# Patient Record
Sex: Male | Born: 1980
Health system: Southern US, Community
[De-identification: ages and names within clinical notes are randomized; demographics above are authoritative.]

## PROBLEM LIST (undated history)

## (undated) DIAGNOSIS — I10 Essential (primary) hypertension: Secondary | ICD-10-CM

## (undated) DIAGNOSIS — E119 Type 2 diabetes mellitus without complications: Secondary | ICD-10-CM

## (undated) HISTORY — PX: ANKLE SURGERY: SHX546

---
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
## 2002-06-07 ENCOUNTER — Emergency Department (HOSPITAL_COMMUNITY): Admission: EM | Admit: 2002-06-07 | Discharge: 2002-06-07 | Payer: Self-pay | Admitting: Emergency Medicine

## 2002-06-08 ENCOUNTER — Emergency Department (HOSPITAL_COMMUNITY): Admission: EM | Admit: 2002-06-08 | Discharge: 2002-06-08 | Payer: Self-pay | Admitting: Emergency Medicine

## 2002-06-10 ENCOUNTER — Emergency Department (HOSPITAL_COMMUNITY): Admission: EM | Admit: 2002-06-10 | Discharge: 2002-06-10 | Payer: Self-pay | Admitting: Emergency Medicine

## 2002-07-28 ENCOUNTER — Encounter: Payer: Self-pay | Admitting: Emergency Medicine

## 2002-07-28 ENCOUNTER — Emergency Department (HOSPITAL_COMMUNITY): Admission: EM | Admit: 2002-07-28 | Discharge: 2002-07-28 | Payer: Self-pay | Admitting: Emergency Medicine

## 2002-09-17 ENCOUNTER — Emergency Department (HOSPITAL_COMMUNITY): Admission: EM | Admit: 2002-09-17 | Discharge: 2002-09-17 | Payer: Self-pay | Admitting: Emergency Medicine

## 2002-12-02 ENCOUNTER — Emergency Department (HOSPITAL_COMMUNITY): Admission: EM | Admit: 2002-12-02 | Discharge: 2002-12-02 | Payer: Self-pay | Admitting: Emergency Medicine

## 2006-01-01 ENCOUNTER — Emergency Department (HOSPITAL_COMMUNITY): Admission: EM | Admit: 2006-01-01 | Discharge: 2006-01-01 | Payer: Self-pay | Admitting: Emergency Medicine

## 2008-04-25 ENCOUNTER — Emergency Department (HOSPITAL_COMMUNITY): Admission: EM | Admit: 2008-04-25 | Discharge: 2008-04-25 | Payer: Self-pay | Admitting: Family Medicine

## 2008-06-13 ENCOUNTER — Emergency Department (HOSPITAL_COMMUNITY): Admission: EM | Admit: 2008-06-13 | Discharge: 2008-06-13 | Payer: Self-pay | Admitting: Emergency Medicine

## 2008-06-26 ENCOUNTER — Inpatient Hospital Stay (HOSPITAL_COMMUNITY): Admission: RE | Admit: 2008-06-26 | Discharge: 2008-06-28 | Payer: Self-pay | Admitting: Orthopedic Surgery

## 2009-11-27 ENCOUNTER — Emergency Department (HOSPITAL_COMMUNITY): Admission: EM | Admit: 2009-11-27 | Discharge: 2009-11-27 | Payer: Self-pay | Admitting: Family Medicine

## 2010-01-15 ENCOUNTER — Emergency Department (HOSPITAL_COMMUNITY): Admission: EM | Admit: 2010-01-15 | Discharge: 2010-01-15 | Payer: Self-pay | Admitting: Emergency Medicine

## 2010-04-18 ENCOUNTER — Emergency Department (HOSPITAL_COMMUNITY): Admission: EM | Admit: 2010-04-18 | Discharge: 2010-04-18 | Payer: Self-pay | Admitting: Emergency Medicine
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
## 2011-07-12 LAB — CBC
HCT: 44.1
Hemoglobin: 13.9
MCHC: 31.5
MCV: 74.5 — ABNORMAL LOW
RBC: 5.91 — ABNORMAL HIGH
WBC: 10.6 — ABNORMAL HIGH

## 2011-12-27 ENCOUNTER — Encounter (HOSPITAL_COMMUNITY): Payer: Self-pay

## 2011-12-27 ENCOUNTER — Emergency Department (INDEPENDENT_AMBULATORY_CARE_PROVIDER_SITE_OTHER)
Admission: EM | Admit: 2011-12-27 | Discharge: 2011-12-27 | Disposition: A | Discharge status: Home / Self Care | Payer: Self-pay | Source: Home / Self Care | Attending: Emergency Medicine | Admitting: Emergency Medicine

## 2011-12-27 DIAGNOSIS — B354 Tinea corporis: Secondary | ICD-10-CM

## 2011-12-27 MED ORDER — TERBINAFINE HCL 1 % EX CREA: TOPICAL_CREAM | Freq: Two times a day (BID) | CUTANEOUS | Status: AC

## 2011-12-27 NOTE — ED Notes (Signed)
C/o rash to arms for 1 week.  States itches and painful in places.  Denies change in hygiene products or medications.  No contacts with rash.

## 2011-12-27 NOTE — ED Provider Notes (Signed)
Chief Complaint  Patient presents with  . Rash    History of Present Illness:   Chris Gray is a 31 year old male who has had a one-week history of a mildly pruritic rash on his right wrist. He denies any suspicious contacts with poison ivy, and these had no changes in his soap, or detergent, washing powder, dryer sheet, or fabric softener. 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 He works at [employer] in [employer].  Review of Systems:  Other than noted above, the patient denies any of the following symptoms: Systemic:  No fever, chills, sweats, weight loss, or fatigue. ENT:  No nasal congestion, rhinorrhea, sore throat, swelling of lips, tongue or throat. Resp:  No cough, wheezing, or shortness of breath. Skin:  No rash, itching, nodules, or suspicious lesions.  PMFSH:  Past medical history, family history, social history, meds, and allergies were reviewed.  Physical Exam:   Vital signs:  BP 152/92  Pulse 83  Temp(Src) 98.1 F (36.7 C) (Oral)  Resp 16  SpO2 98% Gen:  Alert, oriented, in no distress. Skin:  There is an oval, erythematous, scaly rash on the right wrist with well-demarcated borders and some central clearing.  Assessment:   Diagnoses that have been ruled out:  None  Diagnoses that are still under consideration:  None  Final diagnoses:  Tinea corporis    Plan:   1.  The following meds were prescribed:   New Prescriptions   TERBINAFINE (LAMISIL) 1 % CREAM    Apply topically 2 (two) times daily.   2.  The patient was instructed in symptomatic care and handouts were given. 3.  The patient was told to return if becoming worse in any way, if no better in 3 or 4 days, and given some red flag symptoms that would indicate earlier return.     Reuben Likes, MD 12/27/11 2133

## 2011-12-27 NOTE — Discharge Instructions (Signed)
Fungus Infection of the Skin An infection of your skin caused by a fungus is a very common problem. Treatment depends on which part of the body is affected. Types of fungal skin infection include:  Athlete's Foot(Tinea pedis). This infection starts between the toes and may involve the entire sole and sides of foot. It is the most common fungal disease. It is made worse by heat, moisture, and friction. To treat, wash your feet 2 to 3 times daily. Dry thoroughly between the toes. Use medicated foot powder or cream as directed on the package. Plain talc, cornstarch, or rice powder may be dusted into socks and shoes to keep the feet dry. Wearing footwear that allows ventilation is also helpful.   Ringworm (Tinea corporis and tinea capitis). This infection causes scaly red rings to form on the skin or scalp. For skin sores, apply medicated lotion or cream as directed on the package. For the scalp, medicated shampoo may be used with with other therapies. Ringworm of the scalp or fingernails usually requires using oral medicine for 2 to 4 months.   Tinea versicolor. This infection appears as painless, scaly, patchy areas of discolored skin (whitish to light brown). It is more common in the summer and favors oily areas of the skin such as those found at the chest, abdomen, back, pubis, neck, and body folds. It can be treated with medicated shampoo or with medicated topical cream. Oral antifungals may be needed for more active infections. The light and/or dark spots may take time to get better and is not a sign of treatment failure.  Fungal infections may need to be treated for several weeks to be cured. It is important not to treat fungal infections with steroids or combination medicine that contains an antifungal and steroid as these will make the fungal infection worse. SEEK MEDICAL CARE IF:   You have persistent itching or rawness.   You have an oral temperature above 102 F (38.9 C).  Document Released:  11/04/2004 Document Revised: 09/16/2011 Document Reviewed: 01/20/2010 [facility] Patient Information 2012 [place], [place].Ringworm, Body [Tinea Corporis] Ringworm is a fungal infection of the skin and hair. Another name for this problem is Tinea Corporis. It has nothing to do with worms. A fungus is an organism that lives on dead cells (the outer layer of skin). It can involve the entire body. It can spread from infected pets. Tinea corporis can be a problem in wrestlers who may get the infection form other players/opponents, equipment and mats. DIAGNOSIS  A skin scraping can be obtained from the affected area and by looking for fungus under the microscope. This is called a KOH examination.  HOME CARE INSTRUCTIONS   Ringworm may be treated with a topical antifungal cream, ointment, or oral medications.   If you are using a cream or ointment, wash infected skin. Dry it completely before application.   Scrub the skin with a buff puff or abrasive sponge using a shampoo with ketoconazole to remove dead skin and help treat the ringworm.   Have your pet treated by your veterinarian if it has the same infection.  SEEK MEDICAL CARE IF:   Your ringworm patch (fungus) continues to spread after 7 days of treatment.   Your rash is not gone in 4 weeks. Fungal infections are slow to respond to treatment. Some redness (erythema) may remain for several weeks after the fungus is gone.   The area becomes red, warm, tender, and swollen beyond the patch. This may be a secondary  bacterial (germ) infection.   You have a fever.  Document Released: 09/24/2000 Document Revised: 09/16/2011 Document Reviewed: 03/07/2009 [facility] Patient Information 2012 [place], [place].

## 2013-12-31 ENCOUNTER — Encounter (HOSPITAL_COMMUNITY): Payer: Self-pay | Admitting: Emergency Medicine

## 2013-12-31 ENCOUNTER — Emergency Department (INDEPENDENT_AMBULATORY_CARE_PROVIDER_SITE_OTHER)
Admission: EM | Admit: 2013-12-31 | Discharge: 2013-12-31 | Disposition: A | Discharge status: Home / Self Care | Payer: Self-pay | Source: Home / Self Care | Attending: Family Medicine | Admitting: Family Medicine

## 2013-12-31 DIAGNOSIS — J069 Acute upper respiratory infection, unspecified: Secondary | ICD-10-CM

## 2013-12-31 DIAGNOSIS — B359 Dermatophytosis, unspecified: Secondary | ICD-10-CM

## 2013-12-31 MED ORDER — BENZONATATE 100 MG PO CAPS: 100.0000 mg | ORAL_CAPSULE | Freq: Three times a day (TID) | ORAL | Status: DC | PRN

## 2013-12-31 MED ORDER — IPRATROPIUM BROMIDE 0.06 % NA SOLN
2.0000 | Freq: Four times a day (QID) | NASAL | Status: DC
Start: 2013-12-31 — End: 2018-05-27

## 2013-12-31 MED ORDER — TERBINAFINE HCL 1 % EX CREA: 1.0000 "application " | TOPICAL_CREAM | Freq: Two times a day (BID) | CUTANEOUS | Status: DC

## 2013-12-31 MED ORDER — LOPERAMIDE HCL 2 MG PO CAPS: 2.0000 mg | ORAL_CAPSULE | Freq: Four times a day (QID) | ORAL | Status: DC | PRN

## 2013-12-31 NOTE — ED Provider Notes (Signed)
CSN: [id number]     Arrival date & time 12/31/13  1110 History   First MD Initiated Contact with Patient 12/31/13 1253     Chief Complaint  Patient presents with  . 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 male presenting with URI. The history is provided by the patient.  URI Presenting symptoms: congestion, cough, rhinorrhea and sore throat   Presenting symptoms: no fever   Severity:  Moderate Onset quality:  Gradual Duration:  2 days Timing:  Constant Progression:  Unchanged Chronicity:  New Associated symptoms: no arthralgias, no headaches, no myalgias, no neck pain, no sinus pain, no sneezing, no swollen glands and no wheezing   Associated symptoms comment:  +diarrhea Risk factors: no diabetes mellitus, no immunosuppression, no recent illness, no recent travel and no sick contacts     History reviewed. No pertinent past medical history. History reviewed. No pertinent past surgical history. History reviewed. No pertinent family history. History  Substance Use Topics  . Smoking status: Current Every Day Smoker  . Smokeless tobacco: Not on file  . Alcohol Use: Yes    Review of Systems  Constitutional: Negative for fever.  HENT: Positive for congestion, rhinorrhea and sore throat. Negative for sneezing.   Respiratory: Positive for cough. Negative for wheezing.   Musculoskeletal: Negative for arthralgias, myalgias and neck pain.  Neurological: Negative for headaches.  All other systems reviewed and are negative.    Allergies  Review of patient's allergies indicates no known allergies.  Home Medications   Current Outpatient Rx  Name  Route  Sig  Dispense  Refill  . benzonatate (TESSALON) 100 MG capsule   Oral   Take 1 capsule (100 mg total) by mouth 3 (three) times daily as needed for cough.   21 capsule   0   . ipratropium  (ATROVENT) 0.06 % nasal spray   Each Nare   Place 2 sprays into both nostrils 4 (four) times daily.   15 mL   0   . loperamide (IMODIUM) 2 MG capsule   Oral   Take 1 capsule (2 mg total) by mouth 4 (four) times daily as needed for diarrhea or loose stools.   12 capsule   0    BP 140/84  Pulse 75  Temp(Src) 98.4 F (36.9 C) (Oral)  Resp 16  SpO2 98% Physical Exam  Nursing note and vitals reviewed. Constitutional: He is oriented to person, place, and time. He appears well-developed and well-nourished.  +obese  HENT:  Head: Normocephalic and atraumatic.  Right Ear: Hearing, tympanic membrane, external ear and ear canal normal.  Left Ear: Hearing, tympanic membrane, external ear and ear canal normal.  Nose: Nose normal.  Mouth/Throat: Uvula is midline, oropharynx is clear and moist and mucous membranes are normal.  Eyes: Conjunctivae are normal. Right eye exhibits no discharge. Left eye exhibits no discharge. No scleral icterus.  Neck: Normal range of motion. Neck supple.  Cardiovascular: Normal rate, regular rhythm and normal heart sounds.   Pulmonary/Chest: Effort normal and breath sounds normal. No respiratory distress. He has no wheezes.  Abdominal: Soft. Bowel sounds are normal. He exhibits no distension. There is no tenderness.  Musculoskeletal: Normal range of motion.  Lymphadenopathy:    He has no cervical adenopathy.  Neurological: He is alert and oriented to person, place, and time.  Skin: Skin is warm and dry. No rash noted. No erythema.  Psychiatric:  He has a normal mood and affect. His behavior is normal.    ED Course  Procedures (including critical care time) Labs Review Labs Reviewed - No data to display Imaging Review No results found.   MDM   1. URI (upper respiratory infection)    Symptomatic care at home. Fluids, tylenol, ibuprofen, rest. Tessalon for cough, loperamide for diarrhea, Atrovent nasal for congestion.     Jess BartersJennifer Lee LowellPresson,  [place] 12/31/13 1326  Addendum to note: Patient mentioned to RN at time of discharge (after original note completed) that he would like a rash on his right forearm examined as well. Area is consistent with 3x3 cm annular area of tinea (ringworm) and patient provided Rx for topical Lamisil.   Jess BartersJennifer Lee Churchs FerryPresson, [place] 12/31/13 [phone]

## 2013-12-31 NOTE — ED Notes (Signed)
Pt  Reports  Symptoms  Of  Cough  /  Congested        With    Sinus  Drainage   Sensation of  Fever  With  Body x 2  Days                Pt   Has   Symptoms  Of  Dry  scaley rash on r  Wist  Incidentally  As  Well  With  These  Symptoms  X   sev  Weeks

## 2014-01-02 NOTE — ED Provider Notes (Signed)
Medical screening examination/treatment/procedure(s) were performed by a resident physician or non-physician practitioner and as the supervising physician I was immediately available for consultation/collaboration.  Evan Corey, MD    Evan S Corey, MD 01/02/14 0747 

## 2014-03-06 ENCOUNTER — Emergency Department (INDEPENDENT_AMBULATORY_CARE_PROVIDER_SITE_OTHER): Payer: Self-pay

## 2014-03-06 ENCOUNTER — Emergency Department (INDEPENDENT_AMBULATORY_CARE_PROVIDER_SITE_OTHER)
Admission: EM | Admit: 2014-03-06 | Discharge: 2014-03-06 | Disposition: A | Discharge status: Home / Self Care | Payer: Self-pay | Source: Home / Self Care | Attending: Family Medicine | Admitting: Family Medicine

## 2014-03-06 ENCOUNTER — Encounter (HOSPITAL_COMMUNITY): Payer: Self-pay | Admitting: Emergency Medicine

## 2014-03-06 DIAGNOSIS — S93409A Sprain of unspecified ligament of unspecified ankle, initial encounter: Secondary | ICD-10-CM

## 2014-03-06 MED ORDER — DICLOFENAC SODIUM 75 MG PO TBEC: 75.0000 mg | DELAYED_RELEASE_TABLET | Freq: Two times a day (BID) | ORAL | Status: DC | PRN

## 2014-03-06 NOTE — Discharge Instructions (Signed)
Thank you for coming in today. Keep the ankle elevated if possible.  Do the exercises.  Wear the brace when up and active.  Use crutches as needed.    Acute Ankle Sprain with Phase I Rehab An acute ankle sprain is a partial or complete tear in one or more of the ligaments of the ankle due to traumatic injury. The severity of the injury depends on both the the number of ligaments sprained and the grade of sprain. There are 3 grades of sprains.   A grade 1 sprain is a mild sprain. There is a slight pull without obvious tearing. There is no loss of strength, and the muscle and ligament are the correct length.  A grade 2 sprain is a moderate sprain. There is tearing of fibers within the substance of the ligament where it connects two bones or two cartilages. The length of the ligament is increased, and there is usually decreased strength.  A grade 3 sprain is a complete rupture of the ligament and is uncommon. In addition to the grade of sprain, there are three types of ankle sprains.  Lateral ankle sprains: This is a sprain of one or more of the three ligaments on the outer side (lateral) of the ankle. These are the most common sprains. Medial ankle sprains: There is one large triangular ligament of the inner side (medial) of the ankle that is susceptible to injury. Medial ankle sprains are less common. Syndesmosis, "high ankle," sprains: The syndesmosis is the ligament that connects the two bones of the lower leg. Syndesmosis sprains usually only occur with very severe ankle sprains. SYMPTOMS  Pain, tenderness, and swelling in the ankle, starting at the side of injury that may progress to the whole ankle and foot with time.  "Pop" or tearing sensation at the time of injury.  Bruising that may spread to the heel.  Impaired ability to walk soon after injury. CAUSES   Acute ankle sprains are caused by trauma placed on the ankle that temporarily forces or pries the anklebone (talus) out of  its normal socket.  Stretching or tearing of the ligaments that normally hold the joint in place (usually due to a twisting injury). RISK INCREASES WITH:  Previous ankle sprain.  Sports in which the foot may land awkwardly (ie. basketball, volleyball, or soccer) or walking or running on uneven or rough surfaces.  Shoes with inadequate support to prevent sideways motion when stress occurs.  Poor strength and flexibility.  Poor balance skills.  Contact sports. PREVENTION   Warm up and stretch properly before activity.  Maintain physical fitness:  Ankle and leg flexibility, muscle strength, and endurance.  Cardiovascular fitness.  Balance training activities.  Use proper technique and have a coach correct improper technique.  Taping, protective strapping, bracing, or high-top tennis shoes may help prevent injury. Initially, tape is best; however, it loses most of its support function within 10 to 15 minutes.  Wear proper fitted protective shoes (High-top shoes with taping or bracing is more effective than either alone).  Provide the ankle with support during sports and practice activities for 12 months following injury. PROGNOSIS   If treated properly, ankle sprains can be expected to recover completely; however, the length of recovery depends on the degree of injury.  A grade 1 sprain usually heals enough in 5 to 7 days to allow modified activity and requires an average of 6 weeks to heal completely.  A grade 2 sprain requires 6 to 10 weeks to heal  completely.  A grade 3 sprain requires 12 to 16 weeks to heal.  A syndesmosis sprain often takes more than 3 months to heal. RELATED COMPLICATIONS   Frequent recurrence of symptoms may result in a chronic problem. Appropriately addressing the problem the first time decreases the frequency of recurrence and optimizes healing time. Severity of the initial sprain does not predict the likelihood of later instability.  Injury to  other structures (bone, cartilage, or tendon).  A chronically unstable or arthritic ankle joint is a possiblity with repeated sprains. TREATMENT Treatment initially involves the use of ice, medication, and compression bandages to help reduce pain and inflammation. Ankle sprains are usually immobilized in a walking cast or boot to allow for healing. Crutches may be recommended to reduce pressure on the injury. After immobilization, strengthening and stretching exercises may be necessary to regain strength and a full range of motion. Surgery is rarely needed to treat ankle sprains. MEDICATION   Nonsteroidal anti-inflammatory medications, such as aspirin and ibuprofen (do not take for the first 3 days after injury or within 7 days before surgery), or other minor pain relievers, such as acetaminophen, are often recommended. Take these as directed by your caregiver. Contact your caregiver immediately if any bleeding, stomach upset, or signs of an allergic reaction occur from these medications.  Ointments applied to the skin may be helpful.  Pain relievers may be prescribed as necessary by your caregiver. Do not take prescription pain medication for longer than 4 to 7 days. Use only as directed and only as much as you need. HEAT AND COLD  Cold treatment (icing) is used to relieve pain and reduce inflammation for acute and chronic cases. Cold should be applied for 10 to 15 minutes every 2 to 3 hours for inflammation and pain and immediately after any activity that aggravates your symptoms. Use ice packs or an ice massage.  Heat treatment may be used before performing stretching and strengthening activities prescribed by your caregiver. Use a heat pack or a warm soak. SEEK IMMEDIATE MEDICAL CARE IF:   Pain, swelling, or bruising worsens despite treatment.  You experience pain, numbness, discoloration, or coldness in the foot or toes.  New, unexplained symptoms develop (drugs used in treatment may  produce side effects.) EXERCISES  PHASE I EXERCISES RANGE OF MOTION (ROM) AND STRETCHING EXERCISES - Ankle Sprain, Acute Phase I, Weeks 1 to 2 These exercises may help you when beginning to restore flexibility in your ankle. You will likely work on these exercises for the 1 to 2 weeks after your injury. Once your physician, physical therapist, or athletic trainer sees adequate progress, he or she will advance your exercises. While completing these exercises, remember:   Restoring tissue flexibility helps normal motion to return to the joints. This allows healthier, less painful movement and activity.  An effective stretch should be held for at least 30 seconds.  A stretch should never be painful. You should only feel a gentle lengthening or release in the stretched tissue. RANGE OF MOTION - Dorsi/Plantar Flexion  While sitting with your right / left knee straight, draw the top of your foot upwards by flexing your ankle. Then reverse the motion, pointing your toes downward.  Hold each position for __________ seconds.  After completing your first set of exercises, repeat this exercise with your knee bent. Repeat __________ times. Complete this exercise __________ times per day.  RANGE OF MOTION - Ankle Alphabet  Imagine your right / left big toe is a pen.  Keeping your hip and knee still, write out the entire alphabet with your "pen." Make the letters as large as you can without increasing any discomfort. Repeat __________ times. Complete this exercise __________ times per day.  STRENGTHENING EXERCISES - Ankle Sprain, Acute -Phase I, Weeks 1 to 2 These exercises may help you when beginning to restore strength in your ankle. You will likely work on these exercises for 1 to 2 weeks after your injury. Once your physician, physical therapist, or athletic trainer sees adequate progress, he or she will advance your exercises. While completing these exercises, remember:   Muscles can gain both the  endurance and the strength needed for everyday activities through controlled exercises.  Complete these exercises as instructed by your physician, physical therapist, or athletic trainer. Progress the resistance and repetitions only as guided.  You may experience muscle soreness or fatigue, but the pain or discomfort you are trying to eliminate should never worsen during these exercises. If this pain does worsen, stop and make certain you are following the directions exactly. If the pain is still present after adjustments, discontinue the exercise until you can discuss the trouble with your clinician. STRENGTH - Dorsiflexors  Secure a rubber exercise band/tubing to a fixed object (ie. table, pole) and loop the other end around your right / left foot.  Sit on the floor facing the fixed object. The band/tubing should be slightly tense when your foot is relaxed.  Slowly draw your foot back toward you using your ankle and toes.  Hold this position for __________ seconds. Slowly release the tension in the band and return your foot to the starting position. Repeat __________ times. Complete this exercise __________ times per day.  STRENGTH - Plantar-flexors   Sit with your right / left leg extended. Holding onto both ends of a rubber exercise band/tubing, loop it around the ball of your foot. Keep a slight tension in the band.  Slowly push your toes away from you, pointing them downward.  Hold this position for __________ seconds. Return slowly, controlling the tension in the band/tubing. Repeat __________ times. Complete this exercise __________ times per day.  STRENGTH - Ankle Eversion  Secure one end of a rubber exercise band/tubing to a fixed object (table, pole). Loop the other end around your foot just before your toes.  Place your fists between your knees. This will focus your strengthening at your ankle.  Drawing the band/tubing across your opposite foot, slowly, pull your little toe  out and up. Make sure the band/tubing is positioned to resist the entire motion.  Hold this position for __________ seconds. Have your muscles resist the band/tubing as it slowly pulls your foot back to the starting position.  Repeat __________ times. Complete this exercise __________ times per day.  STRENGTH - Ankle Inversion  Secure one end of a rubber exercise band/tubing to a fixed object (table, pole). Loop the other end around your foot just before your toes.  Place your fists between your knees. This will focus your strengthening at your ankle.  Slowly, pull your big toe up and in, making sure the band/tubing is positioned to resist the entire motion.  Hold this position for __________ seconds.  Have your muscles resist the band/tubing as it slowly pulls your foot back to the starting position. Repeat __________ times. Complete this exercises __________ times per day.  STRENGTH - Towel Curls  Sit in a chair positioned on a non-carpeted surface.  Place your right / left foot on a  towel, keeping your heel on the floor.  Pull the towel toward your heel by only curling your toes. Keep your heel on the floor.  If instructed by your physician, physical therapist, or athletic trainer, add weight to the end of the towel. Repeat __________ times. Complete this exercise __________ times per day. Document Released: 04/28/2005 Document Revised: 12/20/2011 Document Reviewed: 01/09/2009 [facility] Patient Information 2014 [place], [place].

## 2014-03-06 NOTE — ED Provider Notes (Signed)
Chris Gray is a 33 y.o. male who presents to Urgent Care today for right ankle pain. Patient suffered an inversion injury to his right ankle 2 days ago. He notes pain and swelling laterally. He's tried an Ace wrap as well as NSAIDs. He feels well otherwise no fevers chills nausea vomiting or diarrhea.   History reviewed. No pertinent past medical history. History  Substance Use Topics  . Smoking status: Current Every Day Smoker  . Smokeless tobacco: Not on file  . Alcohol Use: Yes   ROS as above Medications: No current facility-administered medications for this encounter.   Current Outpatient Prescriptions  Medication Sig Dispense Refill  . diclofenac (VOLTAREN) 75 MG EC tablet Take 1 tablet (75 mg total) by mouth 2 (two) times daily as needed.  60 tablet  0  . [DISCONTINUED] ipratropium (ATROVENT) 0.06 % nasal spray Place 2 sprays into both nostrils 4 (four) times daily.  15 mL  0    Exam:  BP 140/94  Pulse 84  Temp(Src) 98.1 F (36.7 C) (Oral)  Resp 16  SpO2 98% Gen: Well NAD  Right ankle: Swollen and tender laterally. Capillary refill pulses sensation intact distally. Stable ligamentous exam.  No results found for this or any previous visit (from the past 24 hour(s)). Dg Ankle Complete Right  03/06/2014   CLINICAL DATA:  Pain post trauma  EXAM: RIGHT ANKLE - COMPLETE 3+ VIEW  COMPARISON:  None.  FINDINGS: Frontal, oblique, and lateral views were obtained. There is a rounded metallic foreign body within the distal tibia, a known chronic finding. There is mild generalized soft tissue swelling in the ankle region. There is no demonstrable fracture or effusion. Ankle mortise appears intact.  IMPRESSION: Soft tissue swelling. No demonstrable fracture. Mortise intact. Metallic foreign body in the distal tibia, a chronic finding.   Electronically Signed   By: Bretta Bang M.D.   On: 03/06/2014 08:34    Assessment and Plan: 33 y.o. male with ankle sprain. Plan for treatment  with ASO, crutches, NSAIDs, home exercise program.  Discussed warning signs or symptoms. Please see discharge instructions. Patient expresses understanding.    Rodolph Bong, MD 03/06/14 [phone]

## 2014-03-06 NOTE — ED Notes (Signed)
Pt reports he twisted his ankle 2 days ago while waling Sx include swelling and pain when bearing wt Alert w/no signs of acute distress.

## 2015-01-30 ENCOUNTER — Emergency Department (INDEPENDENT_AMBULATORY_CARE_PROVIDER_SITE_OTHER)
Admission: EM | Admit: 2015-01-30 | Discharge: 2015-01-30 | Disposition: A | Discharge status: Home / Self Care | Payer: Self-pay | Source: Home / Self Care

## 2015-01-30 ENCOUNTER — Encounter (HOSPITAL_COMMUNITY): Payer: Self-pay | Admitting: Emergency Medicine

## 2015-01-30 DIAGNOSIS — K029 Dental caries, unspecified: Secondary | ICD-10-CM

## 2015-01-30 MED ORDER — LIDOCAINE VISCOUS 2 % MT SOLN: OROMUCOSAL | Status: DC

## 2015-01-30 MED ORDER — TRAMADOL HCL 50 MG PO TABS: 50.0000 mg | ORAL_TABLET | Freq: Two times a day (BID) | ORAL | Status: DC | PRN

## 2015-01-30 MED ORDER — IBUPROFEN 600 MG PO TABS: 600.0000 mg | ORAL_TABLET | Freq: Four times a day (QID) | ORAL | Status: DC | PRN

## 2015-01-30 NOTE — ED Provider Notes (Signed)
CSN: [id number]     Arrival date & time 01/30/15  [id number] History   None    Chief Complaint  Patient presents with  . Dental Pain   (Consider location/radiation/quality/duration/timing/severity/associated sxs/prior Treatment) Patient is a 34 y.o. male presenting with tooth pain. The history is provided by the patient.  Dental Pain Location:  Lower Lower teeth location:  30/RL 1st molar Quality:  Aching Severity:  Moderate Duration:  1 week Timing:  Constant Chronicity:  Recurrent Context: dental caries   Ineffective treatments:  NSAIDs Risk factors: lack of dental care and smoking     No past medical history on file. No past surgical history on file. No family history on file. History  Substance Use Topics  . Smoking status: Current Every Day Smoker  . Smokeless tobacco: Not on file  . Alcohol Use: Yes    Review of Systems  All other systems reviewed and are negative.   Allergies  Review of patient's allergies indicates no known allergies.  Home Medications   Prior to Admission medications   Medication Sig Start Date End Date Taking? Authorizing Provider  diclofenac (VOLTAREN) 75 MG EC tablet Take 1 tablet (75 mg total) by mouth 2 (two) times daily as needed. 03/06/14   Rodolph BongEvan S Corey, MD  ibuprofen (ADVIL,MOTRIN) 600 MG tablet Take 1 tablet (600 mg total) by mouth every 6 (six) hours as needed for mild pain or moderate pain. 01/30/15   Ria ClockJennifer Lee H Hiral Lukasiewicz, PA  lidocaine (XYLOCAINE) 2 % solution Apply to affected area using cotton swab every 3 hours as needed for pain 01/30/15   Ria ClockJennifer Lee H Epiphany Seltzer, PA  traMADol (ULTRAM) 50 MG tablet Take 1 tablet (50 mg total) by mouth every 12 (twelve) hours as needed for moderate pain or severe pain. 01/30/15   Jess BartersJennifer Lee H Rodrecus Belsky, PA   BP 138/97 mmHg  Pulse 64  Temp(Src) 97.6 F (36.4 C) (Oral)  Resp 12  SpO2 100% Physical Exam  Constitutional: He is oriented to person, place, and time. He appears well-developed and  well-nourished. No distress.  HENT:  Head: Normocephalic and atraumatic.  Right Ear: Hearing and external ear normal. No mastoid tenderness.  Left Ear: Hearing and external ear normal. No mastoid tenderness.  Nose: Nose normal.  Mouth/Throat: Uvula is midline, oropharynx is clear and moist and mucous membranes are normal. No oral lesions. No trismus in the jaw. Dental caries present. No dental abscesses or uvula swelling.    Cardiovascular: Normal rate.   Pulmonary/Chest: Effort normal.  Musculoskeletal: Normal range of motion.  Neurological: He is alert and oriented to person, place, and time.  Skin: Skin is warm and dry.  Psychiatric: He has a normal mood and affect. His behavior is normal.  Nursing note and vitals reviewed.   ED Course  Procedures (including critical care time) Labs Review Labs Reviewed - No data to display  Imaging Review No results found.   MDM   1. Pain due to dental caries    Ibuprofen  Tramadol Topical xylocaine Dental follow up    Ria ClockJennifer Lee H Gracie Gupta, PA 01/30/15 1147

## 2015-01-30 NOTE — Discharge Instructions (Signed)

## 2015-01-30 NOTE — ED Notes (Signed)
Toothache for one week, pain in right, bottom tooth.

## 2015-07-03 IMAGING — CR DG ANKLE COMPLETE 3+V*R*
3 series · 3 of 3 positions shown · non-contrast
Comparison: None.

CLINICAL DATA: Pain post trauma

EXAM:
RIGHT ANKLE - COMPLETE 3+ VIEW

[view not recorded (1 of 3)]
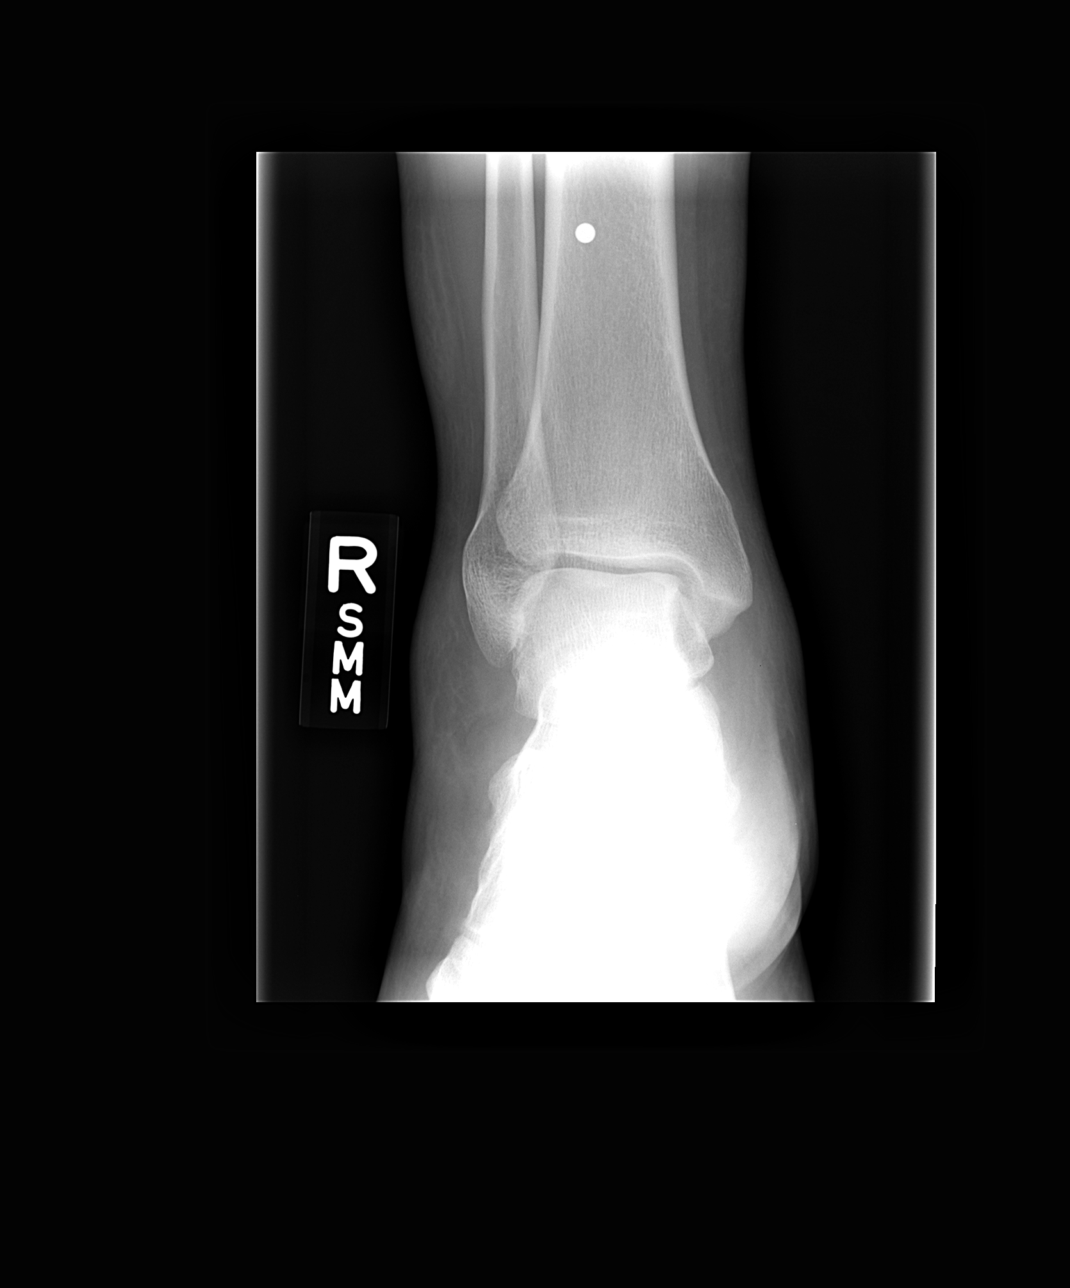

[view not recorded (2 of 3)]
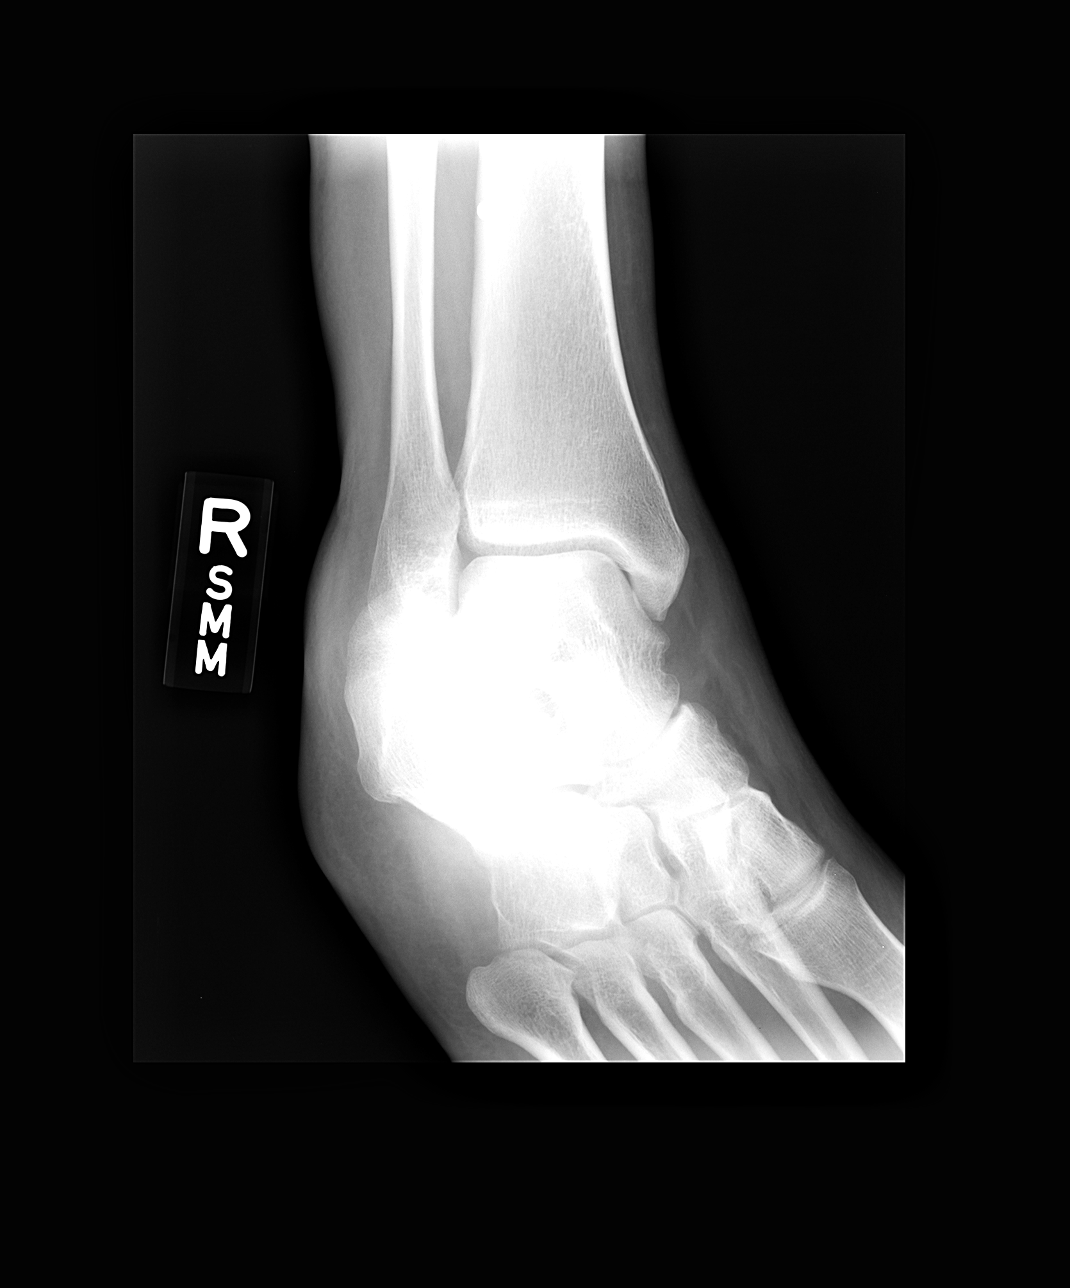

[view not recorded (3 of 3)]
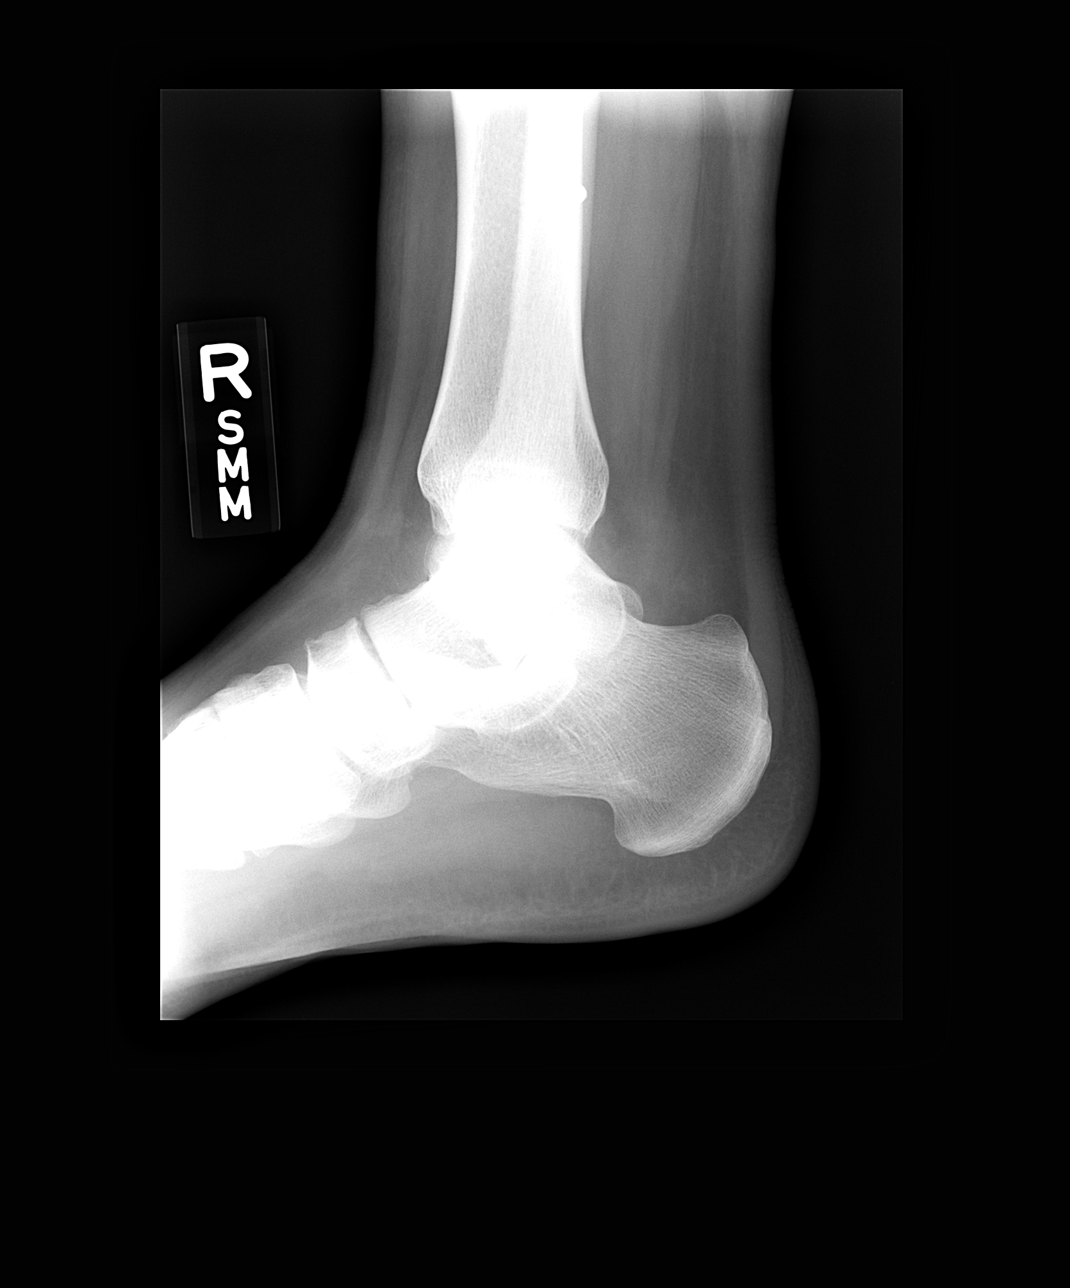

[3 of 3 positions shown; findings below may reference images not displayed]

FINDINGS: Frontal, oblique, and lateral views were obtained. There is a
rounded metallic foreign body within the distal tibia, a known
chronic finding. There is mild generalized soft tissue swelling in
the ankle region. There is no demonstrable fracture or effusion.
Ankle mortise appears intact.
IMPRESSION: Soft tissue swelling. No demonstrable fracture. Mortise intact.
Metallic foreign body in the distal tibia, a chronic finding.

## 2015-10-03 ENCOUNTER — Emergency Department (HOSPITAL_COMMUNITY)
Admission: EM | Admit: 2015-10-03 | Discharge: 2015-10-03 | Disposition: A | Discharge status: Home / Self Care | Payer: Self-pay | Attending: Emergency Medicine | Admitting: Emergency Medicine

## 2015-10-03 ENCOUNTER — Encounter (HOSPITAL_COMMUNITY): Payer: Self-pay

## 2015-10-03 DIAGNOSIS — M25572 Pain in left ankle and joints of left foot: Secondary | ICD-10-CM | POA: Insufficient documentation

## 2015-10-03 DIAGNOSIS — Z8781 Personal history of (healed) traumatic fracture: Secondary | ICD-10-CM | POA: Insufficient documentation

## 2015-10-03 DIAGNOSIS — F17200 Nicotine dependence, unspecified, uncomplicated: Secondary | ICD-10-CM | POA: Insufficient documentation

## 2015-10-03 DIAGNOSIS — B354 Tinea corporis: Secondary | ICD-10-CM | POA: Insufficient documentation

## 2015-10-03 MED ORDER — TRAMADOL HCL 50 MG PO TABS: 50.0000 mg | ORAL_TABLET | Freq: Two times a day (BID) | ORAL | Status: DC | PRN

## 2015-10-03 MED ORDER — TERBINAFINE HCL 250 MG PO TABS: 250.0000 mg | ORAL_TABLET | Freq: Every day | ORAL | Status: DC

## 2015-10-03 NOTE — ED Provider Notes (Signed)
CSN: [id number]     Arrival date & time 10/03/15  1833 History  By signing my name below, I, Lyndel SafeKaitlyn Shelton, attest that this documentation has been prepared under the direction and in the presence of Marlon Peliffany Alyson Ki, PA-C.  Electronically Signed: Lyndel SafeKaitlyn Shelton, ED Scribe. 10/03/2015. 7:09 PM.   Chief Complaint  Patient presents with  . Ankle Pain  . Rash   The history is provided by the patient. No language interpreter was used.   HPI Comments: Chris Gray is a 34 y.o. male, with a PMhx of left ankle fracture s/p surgical repair, who presents to the Emergency Department complaining of constant, moderate left ankle pain that is chronic in nature s/p fracture but worsened recently without injury or trauma. Pt states his left ankle pain is worse with long periods of standing or cold weather. He has not tried any alleviating treatments or taken any medication for his ankle pain. He denies numbness, tingling, weakness, new or worsening swelling, or difficulty ambulating.   Pt is also c/o a erythematic, pruritic rash to BUE and bilateral feet X 1-2 months. He reports the rash began on his left upper extremity but has since spread to RUE and bilateral feet. The rash is exacerbated to a burning pain with hot water or diaphoresis. No prior similar rash. No history of eczema. Denies fevers or chills.    History reviewed. No pertinent past medical history. History reviewed. No pertinent past surgical history. History reviewed. No pertinent family history. Social History  Substance Use Topics  . Smoking status: Current Every Day Smoker  . Smokeless tobacco: None  . Alcohol Use: Yes    Review of Systems  Constitutional: Negative for fever and chills.  Musculoskeletal: Positive for arthralgias ( left ankle). Negative for joint swelling and gait problem.  Skin: Positive for rash.  Neurological: Negative for weakness and numbness.  All other systems reviewed and are negative.  Allergies   Review of patient's allergies indicates no known allergies.  Home Medications   Prior to Admission medications   Medication Sig Start Date End Date Taking? Authorizing Provider  diclofenac (VOLTAREN) 75 MG EC tablet Take 1 tablet (75 mg total) by mouth 2 (two) times daily as needed. Patient not taking: Reported on 01/30/2015 03/06/14   Rodolph BongEvan S Corey, MD  ibuprofen (ADVIL,MOTRIN) 200 MG tablet Take 200 mg by mouth every 6 (six) hours as needed.    Historical Provider, MD  ibuprofen (ADVIL,MOTRIN) 600 MG tablet Take 1 tablet (600 mg total) by mouth every 6 (six) hours as needed for mild pain or moderate pain. 01/30/15   Mathis FareJennifer Lee H Presson, PA  lidocaine (XYLOCAINE) 2 % solution Apply to affected area using cotton swab every 3 hours as needed for pain 01/30/15   Mathis FareJennifer Lee H Presson, PA  terbinafine (LAMISIL) 250 MG tablet Take 1 tablet (250 mg total) by mouth daily. 10/03/15   Marlon Peliffany Eleena Grater, PA-C  traMADol (ULTRAM) 50 MG tablet Take 1 tablet (50 mg total) by mouth every 12 (twelve) hours as needed for moderate pain or severe pain. 10/03/15   Lonald Troiani Neva SeatGreene, PA-C   BP 172/97 mmHg  Pulse 90  Temp(Src) 98.3 F (36.8 C) (Oral)  Resp 18  SpO2 100% Physical Exam  Constitutional: He is oriented to person, place, and time. He appears well-developed and well-nourished. No distress.  HENT:  Head: Normocephalic.  Eyes: Conjunctivae are normal.  Neck: Normal range of motion. Neck supple.  Cardiovascular: Normal rate.   Pulmonary/Chest: Effort normal.  No respiratory distress.  Musculoskeletal: Normal range of motion.  Tenderness to palpation of left ankle, old surgical scars but otherwise no abnormal physical exam findings.  Neurological: He is alert and oriented to person, place, and time. Coordination normal.  Skin: Skin is warm. Rash (flaky rash to bialteral arms, hand, feet. small area on back. It does not involve the flexor joints.) noted. No purpura noted. Rash is not papular, not pustular,  not vesicular and not urticarial.  Psychiatric: He has a normal mood and affect. His behavior is normal.  Nursing note and vitals reviewed.   ED Course  Procedures  DIAGNOSTIC STUDIES: Oxygen Saturation is 100% on RA, normal by my interpretation.    COORDINATION OF CARE: 7:02 PM Discussed treatment plan which includes to prescribe an oral Lamisil course and ultram with pt. Pt acknowledges and agrees to plan.  MDM   Final diagnoses:  Ankle pain, left  Tinea corporis    Imaging not indicated at this time.  Pt advised to follow up with orthopedics. Will prescribe ultram. and conservative therapy recommended and discussed. Patient will be discharged home & is agreeable with above plan. Returns precautions discussed. Pt appears safe for discharge.  Patient with tinea infection. Will treat with anti-fungal medication, Lamisil 250 mg PO BID for 4 weeks. Pt instructed to keep the area dry. Contact precautions given. No signs of secondary infection. Follow up with Dermatology in 2-3 days. Return precautions discussed. Pt is safe for discharge at this time.   I personally performed the services described in this documentation, which was scribed in my presence. The recorded information has been reviewed and is accurate.   Marlon Pel, PA-C 10/03/15 1950  Glynn Octave, MD 10/03/15 [id number]

## 2015-10-03 NOTE — ED Notes (Signed)
Pt c/o left ankle pain has 7 screws and a plate in from previous injury.  Pt also has upper extremity rash.

## 2015-10-03 NOTE — ED Notes (Signed)
Pt stable, ambulatory, states understanding of discharge instructions 

## 2015-10-03 NOTE — Discharge Instructions (Signed)
Ankle Pain Ankle pain is a common symptom. The bones, cartilage, tendons, and muscles of the ankle joint perform a lot of work each day. The ankle joint holds your body weight and allows you to move around. Ankle pain can occur on either side or back of 1 or both ankles. Ankle pain may be sharp and burning or dull and aching. There may be tenderness, stiffness, redness, or warmth around the ankle. The pain occurs more often when a person walks or puts pressure on the ankle. CAUSES  There are many reasons ankle pain can develop. It is important to work with your caregiver to identify the cause since many conditions can impact the bones, cartilage, muscles, and tendons. Causes for ankle pain include:  Injury, including a break (fracture), sprain, or strain often due to a fall, sports, or a high-impact activity.  Swelling (inflammation) of a tendon (tendonitis).  Achilles tendon rupture.  Ankle instability after repeated sprains and strains.  Poor foot alignment.  Pressure on a nerve (tarsal tunnel syndrome).  Arthritis in the ankle or the lining of the ankle.  Crystal formation in the ankle (gout or pseudogout). DIAGNOSIS  A diagnosis is based on your medical history, your symptoms, results of your physical exam, and results of diagnostic tests. Diagnostic tests may include X-ray exams or a computerized magnetic scan (magnetic resonance imaging, MRI). TREATMENT  Treatment will depend on the cause of your ankle pain and may include:  Keeping pressure off the ankle and limiting activities.  Using crutches or other walking support (a cane or brace).  Using rest, ice, compression, and elevation.  Participating in physical therapy or home exercises.  Wearing shoe inserts or special shoes.  Losing weight.  Taking medications to reduce pain or swelling or receiving an injection.  Undergoing surgery. HOME CARE INSTRUCTIONS   Only take over-the-counter or prescription medicines for  pain, discomfort, or fever as directed by your caregiver.  Put ice on the injured area.  Put ice in a plastic bag.  Place a towel between your skin and the bag.  Leave the ice on for 15-20 minutes at a time, 03-04 times a day.  Keep your leg raised (elevated) when possible to lessen swelling.  Avoid activities that cause ankle pain.  Follow specific exercises as directed by your caregiver.  Record how often you have ankle pain, the location of the pain, and what it feels like. This information may be helpful to you and your caregiver.  Ask your caregiver about returning to work or sports and whether you should drive.  Follow up with your caregiver for further examination, therapy, or testing as directed. SEEK MEDICAL CARE IF:   Pain or swelling continues or worsens beyond 1 week.  You have an oral temperature above 102 F (38.9 C).  You are feeling unwell or have chills.  You are having an increasingly difficult time with walking.  You have loss of sensation or other new symptoms.  You have questions or concerns. MAKE SURE YOU:   Understand these instructions.  Will watch your condition.  Will get help right away if you are not doing well or get worse.   This information is not intended to replace advice given to you by your health care provider. Make sure you discuss any questions you have with your health care provider.   Document Released: 03/17/2010 Document Revised: 12/20/2011 Document Reviewed: 04/29/2015 Elsevier Interactive Patient Education 2016 [employer]  Body Ringworm Ringworm (tinea corporis) is a fungal  infection of the skin on the body. This infection is not caused by worms, but is actually caused by a fungus. Fungus normally lives on the top of your skin and can be useful. However, in the case of ringworms, the fungus grows out of control and causes a skin infection. It can involve any area of skin on the body and can spread easily from one person  to another (contagious). Ringworm is a common problem for children, but it can affect adults as well. Ringworm is also often found in athletes, especially wrestlers who share equipment and mats.  CAUSES  Ringworm of the body is caused by a fungus called dermatophyte. It can spread by:  Touchingother people who are infected.  Touchinginfected pets.  Touching or sharingobjects that have been in contact with the infected person or pet (hats, combs, towels, clothing, sports equipment). SYMPTOMS   Itchy, raised red spots and bumps on the skin.  Ring-shaped rash.  Redness near the border of the rash with a clear center.  Dry and scaly skin on or around the rash. Not every person develops a ring-shaped rash. Some develop only the red, scaly patches. DIAGNOSIS  Most often, ringworm can be diagnosed by performing a skin exam. Your caregiver may choose to take a skin scraping from the affected area. The sample will be examined under the microscope to see if the fungus is present.  TREATMENT  Body ringworm may be treated with a topical antifungal cream or ointment. Sometimes, an antifungal shampoo that can be used on your body is prescribed. You may be prescribed antifungal medicines to take by mouth if your ringworm is severe, keeps coming back, or lasts a long time.  HOME CARE INSTRUCTIONS   Only take over-the-counter or prescription medicines as directed by your caregiver.  Wash the infected area and dry it completely before applying yourcream or ointment.  When using antifungal shampoo to treat the ringworm, leave the shampoo on the body for 3-5 minutes before rinsing.   Wear loose clothing to stop clothes from rubbing and irritating the rash.  Wash or change your bed sheets every night while you have the rash.  Have your pet treated by your veterinarian if it has the same infection. To prevent ringworm:   Practice good hygiene.  Wear sandals or shoes in public places and  showers.  Do not share personal items with others.  Avoid touching red patches of skin on other people.  Avoid touching pets that have bald spots or wash your hands after doing so. SEEK MEDICAL CARE IF:   Your rash continues to spread after 7 days of treatment.  Your rash is not gone in 4 weeks.  The area around your rash becomes red, warm, tender, and swollen.   This information is not intended to replace advice given to you by your health care provider. Make sure you discuss any questions you have with your health care provider.   Document Released: 09/24/2000 Document Revised: 06/21/2012 Document Reviewed: 04/10/2012 Elsevier Interactive Patient Education [employer]

## 2015-10-13 ENCOUNTER — Encounter (HOSPITAL_COMMUNITY): Payer: Self-pay | Admitting: *Deleted

## 2015-10-13 ENCOUNTER — Emergency Department (HOSPITAL_COMMUNITY): Payer: Self-pay

## 2015-10-13 ENCOUNTER — Emergency Department (HOSPITAL_COMMUNITY)
Admission: EM | Admit: 2015-10-13 | Discharge: 2015-10-13 | Disposition: A | Discharge status: Home / Self Care | Payer: Self-pay | Attending: Emergency Medicine | Admitting: Emergency Medicine

## 2015-10-13 DIAGNOSIS — Y9389 Activity, other specified: Secondary | ICD-10-CM | POA: Insufficient documentation

## 2015-10-13 DIAGNOSIS — W268XXA Contact with other sharp object(s), not elsewhere classified, initial encounter: Secondary | ICD-10-CM | POA: Insufficient documentation

## 2015-10-13 DIAGNOSIS — Z23 Encounter for immunization: Secondary | ICD-10-CM | POA: Insufficient documentation

## 2015-10-13 DIAGNOSIS — Y990 Civilian activity done for income or pay: Secondary | ICD-10-CM | POA: Insufficient documentation

## 2015-10-13 DIAGNOSIS — S61213A Laceration without foreign body of left middle finger without damage to nail, initial encounter: Secondary | ICD-10-CM | POA: Insufficient documentation

## 2015-10-13 DIAGNOSIS — Y9289 Other specified places as the place of occurrence of the external cause: Secondary | ICD-10-CM | POA: Insufficient documentation

## 2015-10-13 DIAGNOSIS — F17200 Nicotine dependence, unspecified, uncomplicated: Secondary | ICD-10-CM | POA: Insufficient documentation

## 2015-10-13 MED ORDER — TETANUS-DIPHTH-ACELL PERTUSSIS 5-2.5-18.5 LF-MCG/0.5 IM SUSP
0.5000 mL | Freq: Once | INTRAMUSCULAR | Status: AC
  Administered 2015-10-13: 0.5 mL via INTRAMUSCULAR
  Filled 2015-10-13: qty 0.5

## 2015-10-13 MED ORDER — IBUPROFEN 400 MG PO TABS
800.0000 mg | ORAL_TABLET | Freq: Once | ORAL | Status: AC
  Administered 2015-10-13: 800 mg via ORAL
  Filled 2015-10-13: qty 2

## 2015-10-13 MED ORDER — LIDOCAINE HCL (PF) 1 % IJ SOLN
30.0000 mL | Freq: Once | INTRAMUSCULAR | Status: AC
  Administered 2015-10-13: 30 mL
  Filled 2015-10-13: qty 30

## 2015-10-13 NOTE — ED Notes (Signed)
SEE PA Assessment 

## 2015-10-13 NOTE — ED Notes (Signed)
PT reports he cut the LT third finger at work with razor blade.

## 2015-10-13 NOTE — ED Provider Notes (Addendum)
CSN: [id number]     Arrival date & time 10/13/15  7829 History   First MD Initiated Contact with Patient 10/13/15 [phone]     Chief Complaint  Patient presents with  . Laceration    HPI   Chris Gray is a 35 y.o. male with no pertinent PMH who presents to the ED with laceration to his left third finger. He states he cut his finger on a razor blade at work approximately 30 minutes prior to arrival. He reports movement exacerbates his bleeding. He states he applied pressure to control the bleeding. He notes mild pain and numbness to his finger. He denies weakness, paresthesia, anticoagulant use. He states his tetanus is not up to date.   History reviewed. No pertinent past medical history. Past Surgical History  Procedure Laterality Date  . Ankle surgery Right     PT reports pins and screws are present   History reviewed. No pertinent family history. Social History  Substance Use Topics  . Smoking status: Current Every Day Smoker  . Smokeless tobacco: None  . Alcohol Use: Yes     Review of Systems  Musculoskeletal: Positive for arthralgias.  Skin: Positive for wound.  Neurological: Positive for numbness. Negative for weakness.      Allergies  Review of patient's allergies indicates no known allergies.  Home Medications   Prior to Admission medications   Medication Sig Start Date End Date Taking? Authorizing Provider  diclofenac (VOLTAREN) 75 MG EC tablet Take 1 tablet (75 mg total) by mouth 2 (two) times daily as needed. 03/06/14  Yes Rodolph Bong, MD  ibuprofen (ADVIL,MOTRIN) 200 MG tablet Take 200 mg by mouth every 6 (six) hours as needed.   Yes Historical Provider, MD  ibuprofen (ADVIL,MOTRIN) 600 MG tablet Take 1 tablet (600 mg total) by mouth every 6 (six) hours as needed for mild pain or moderate pain. 01/30/15  Yes Ria Clock, PA  lidocaine (XYLOCAINE) 2 % solution Apply to affected area using cotton swab every 3 hours as needed for pain 01/30/15  Yes  Mathis Fare Presson, PA  terbinafine (LAMISIL) 250 MG tablet Take 1 tablet (250 mg total) by mouth daily. 10/03/15  Yes Tiffany Neva Seat, PA-C  traMADol (ULTRAM) 50 MG tablet Take 1 tablet (50 mg total) by mouth every 12 (twelve) hours as needed for moderate pain or severe pain. 10/03/15  Yes Tiffany Neva Seat, PA-C    BP 141/94 mmHg  Pulse 73  Temp(Src) 97.9 F (36.6 C) (Oral)  Resp 18  Ht 5\' 11"  (1.803 m)  Wt 111.131 kg  BMI 34.19 kg/m2  SpO2 97% Physical Exam  Constitutional: He is oriented to person, place, and time. He appears well-developed and well-nourished. No distress.  HENT:  Head: Normocephalic and atraumatic.  Right Ear: External ear normal.  Left Ear: External ear normal.  Nose: Nose normal.  Eyes: Conjunctivae and EOM are normal. Right eye exhibits no discharge. Left eye exhibits no discharge. No scleral icterus.  Neck: Normal range of motion. Neck supple.  Cardiovascular: Normal rate, regular rhythm and intact distal pulses.   Distal pulses intact.  Pulmonary/Chest: Effort normal and breath sounds normal. No respiratory distress.  Musculoskeletal: Normal range of motion. He exhibits no edema or tenderness.  Full ROM of left hand.  Neurological: He is alert and oriented to person, place, and time.  Sensation to light touch intact.  Skin: Skin is warm and dry. He is not diaphoretic.  3 cm laceration to palmar  aspect of left 3rd finger. No tendon involvement.  Psychiatric: He has a normal mood and affect. His behavior is normal.  Nursing note and vitals reviewed.   ED Course  .Marland Kitchen.Laceration Repair Date/Time: 10/13/2015 10:07 AM Performed by: Glean HessWESTFALL, Diya Gervasi C Authorized by: Glean HessWESTFALL, Loredana Medellin C Consent: Verbal consent obtained. Risks and benefits: risks, benefits and alternatives were discussed Consent given by: patient Patient understanding: patient states understanding of the procedure being performed Patient consent: the patient's understanding of the  procedure matches consent given Procedure consent: procedure consent matches procedure scheduled Relevant documents: relevant documents present and verified Site marked: the operative site was marked Required items: required blood products, implants, devices, and special equipment available Patient identity confirmed: verbally with patient Time out: Immediately prior to procedure a "time out" was called to verify the correct patient, procedure, equipment, support staff and site/side marked as required. Body area: upper extremity Location details: left long finger Laceration length: 3 cm Foreign bodies: no foreign bodies Tendon involvement: none Nerve involvement: none Vascular damage: no Anesthesia: digital block Local anesthetic: lidocaine 1% without epinephrine Anesthetic total: 6 ml Patient sedated: no Preparation: Patient was prepped and draped in the usual sterile fashion. Irrigation solution: saline Irrigation method: tap Amount of cleaning: standard Debridement: none Degree of undermining: none Skin closure: 4-0 Prolene Number of sutures: 3 Technique: simple Approximation: close Approximation difficulty: simple Dressing: 4x4 sterile gauze Patient tolerance: Patient tolerated the procedure well with no immediate complications   Labs Review Labs Reviewed - No data to display  Imaging Review Dg Hand Complete Left  10/13/2015  CLINICAL DATA:  35 year old male with history of laceration to the left third finger today on the anterior aspect between the DIP and PIP joints. Bleeding and swelling. EXAM: LEFT HAND - COMPLETE 3+ VIEW COMPARISON:  No priors. FINDINGS: Three views of the left hand demonstrate no acute displaced fracture, subluxation, dislocation, joint or soft tissue abnormality. Specifically, no retained radiopaque foreign body in the soft tissues. IMPRESSION: 1. No acute radiographic abnormality of the left hand. Electronically Signed   By: Trudie Reedaniel  Entrikin M.D.    On: 10/13/2015 09:25   I have personally reviewed and evaluated these images as part of my medical decision-making.   EKG Interpretation None      MDM   Final diagnoses:  Laceration of third finger, left, initial encounter    35 year old male presents with laceration to his left third finger, which he states occurred at work prior to arrival while he was cutting a box with a razor blade. He reports mild numbness and pain. He denies paresthesia or weakness. On exam, patient has 3 cm laceration to the palmar aspect of his left third finger, hemostatic. Imaging negative for acute radiographic abnormality of the left hand. Laceration cleaned, repaired, and dressed, which the patient tolerated well. Will apply finger splint. Tetanus updated. Patient to follow-up in 7 days for suture removal. Return precautions discussed. Patient verbalizes his understanding and is in agreement with plan.  BP 141/94 mmHg  Pulse 73  Temp(Src) 97.9 F (36.6 C) (Oral)  Resp 18  Ht 5\' 11"  (1.803 m)  Wt 111.131 kg  BMI 34.19 kg/m2  SpO2 97%     Mady Gemmalizabeth C Laetitia Schnepf, PA-C 10/13/15 1021  Pricilla LovelessScott Goldston, MD 10/14/15 0745  Mady GemmaElizabeth C Tomer Chalmers, PA-C 10/22/15 1455  Pricilla LovelessScott Goldston, MD 10/24/15 [phone]

## 2015-10-13 NOTE — Discharge Instructions (Signed)
1. Medications: usual home medications 2. Treatment: rest, drink plenty of fluids, change dressing daily and keep clean and dry 3. Follow Up: please followup with your primary doctor in 7 days for suture removal; if you do not have a primary care doctor use the resource guide provided to find one; please return to the ER for increased pain, redness, swelling, new or worsening symptoms   Laceration Care, Adult A laceration is a cut that goes through all layers of the skin. The cut also goes into the tissue that is right under the skin. Some cuts heal on their own. Others need to be closed with stitches (sutures), staples, skin adhesive strips, or wound glue. Taking care of your cut lowers your risk of infection and helps your cut to heal better. HOW TO TAKE CARE OF YOUR CUT For stitches or staples:  Keep the wound clean and dry.  If you were given a bandage (dressing), you should change it at least one time per day or as told by your doctor. You should also change it if it gets wet or dirty.  Keep the wound completely dry for the first 24 hours or as told by your doctor. After that time, you may take a shower or a bath. However, make sure that the wound is not soaked in water until after the stitches or staples have been removed.  Clean the wound one time each day or as told by your doctor:  Wash the wound with soap and water.  Rinse the wound with water until all of the soap comes off.  Pat the wound dry with a clean towel. Do not rub the wound.  After you clean the wound, put a thin layer of antibiotic ointment on it as told by your doctor. This ointment:  Helps to prevent infection.  Keeps the bandage from sticking to the wound.  Have your stitches or staples removed as told by your doctor. If your doctor used skin adhesive strips:   Keep the wound clean and dry.  If you were given a bandage, you should change it at least one time per day or as told by your doctor. You should  also change it if it gets dirty or wet.  Do not get the skin adhesive strips wet. You can take a shower or a bath, but be careful to keep the wound dry.  If the wound gets wet, pat it dry with a clean towel. Do not rub the wound.  Skin adhesive strips fall off on their own. You can trim the strips as the wound heals. Do not remove any strips that are still stuck to the wound. They will fall off after a while. If your doctor used wound glue:  Try to keep your wound dry, but you may briefly wet it in the shower or bath. Do not soak the wound in water, such as by swimming.  After you take a shower or a bath, gently pat the wound dry with a clean towel. Do not rub the wound.  Do not do any activities that will make you really sweaty until the skin glue has fallen off on its own.  Do not apply liquid, cream, or ointment medicine to your wound while the skin glue is still on.  If you were given a bandage, you should change it at least one time per day or as told by your doctor. You should also change it if it gets dirty or wet.  If a bandage  is placed over the wound, do not let the tape for the bandage touch the skin glue.  Do not pick at the glue. The skin glue usually stays on for 5-10 days. Then, it falls off of the skin. General Instructions  To help prevent scarring, make sure to cover your wound with sunscreen whenever you are outside after stitches are removed, after adhesive strips are removed, or when wound glue stays in place and the wound is healed. Make sure to wear a sunscreen of at least 30 SPF.  Take over-the-counter and prescription medicines only as told by your doctor.  If you were given antibiotic medicine or ointment, take or apply it as told by your doctor. Do not stop using the antibiotic even if your wound is getting better.  Do not scratch or pick at the wound.  Keep all follow-up visits as told by your doctor. This is important.  Check your wound every day for  signs of infection. Watch for:  Redness, swelling, or pain.  Fluid, blood, or pus.  Raise (elevate) the injured area above the level of your heart while you are sitting or lying down, if possible. GET HELP IF:  You got a tetanus shot and you have any of these problems at the injection site:  Swelling.  Very bad pain.  Redness.  Bleeding.  You have a fever.  A wound that was closed breaks open.  You notice a bad smell coming from your wound or your bandage.  You notice something coming out of the wound, such as wood or glass.  Medicine does not help your pain.  You have more redness, swelling, or pain at the site of your wound.  You have fluid, blood, or pus coming from your wound.  You notice a change in the color of your skin near your wound.  You need to change the bandage often because fluid, blood, or pus is coming from the wound.  You start to have a new rash.  You start to have numbness around the wound. GET HELP RIGHT AWAY IF:  You have very bad swelling around the wound.  Your pain suddenly gets worse and is very bad.  You notice painful lumps near the wound or on skin that is anywhere on your body.  You have a red streak going away from your wound.  The wound is on your hand or foot and you cannot move a finger or toe like you usually can.  The wound is on your hand or foot and you notice that your fingers or toes look pale or bluish.   This information is not intended to replace advice given to you by your health care provider. Make sure you discuss any questions you have with your health care provider.   Document Released: 03/15/2008 Document Revised: 02/11/2015 Document Reviewed: 09/23/2014 Elsevier Interactive Patient Education 2016 [employer]   Emergency Department Resource Guide 1) Find a Doctor and Pay Out of Pocket Although you won't have to find out who is covered by your insurance plan, it is a good idea to ask around and get  recommendations. You will then need to call the office and see if the doctor you have chosen will accept you as a new patient and what types of options they offer for patients who are self-pay. Some doctors offer discounts or will set up payment plans for their patients who do not have insurance, but you will need to ask so you aren't surprised when you  get to your appointment.  2) Contact Your Local Health Department Not all health departments have doctors that can see patients for sick visits, but many do, so it is worth a call to see if yours does. If you don't know where your local health department is, you can check in your phone book. The CDC also has a tool to help you locate your state's health department, and many state websites also have listings of all of their local health departments.  3) Find a Walk-in Clinic If your illness is not likely to be very severe or complicated, you may want to try a walk in clinic. These are popping up all over the country in pharmacies, drugstores, and shopping centers. They're usually staffed by nurse practitioners or physician assistants that have been trained to treat common illnesses and complaints. They're usually fairly quick and inexpensive. However, if you have serious medical issues or chronic medical problems, these are probably not your best option.  No Primary Care Doctor: - Call Health Connect at  [phone] - they can help you locate a primary care doctor that  accepts your insurance, provides certain services, etc. - Physician Referral Service- [phone]  Chronic Pain Problems: Organization         Address  Phone   Notes  [facility]  [phone] Patients need to be referred by their primary care doctor.   Medication Assistance: Organization         Address  Phone   Notes  [facility] Medication [facility] [address] [place] [place], [place] [address] [phone] --Must be a resident of  [place] -- Must have NO insurance coverage whatsoever (no Medicaid/ Medicare, etc.) -- The pt. MUST have a primary care doctor that directs their care regularly and follows them in the community   [facility]  [phone]   [employer]  [phone]    Agencies that provide inexpensive medical care: Organization         Address  Phone   Notes  [facility]  [phone]   [facility]    [phone]   [facility] [address] [place], [place] [address] [phone]   [facility] [address] [place] [phone]   Planned Parenthood    [phone]   [facility]    [phone]   [facility] and [facility]  [address], [place] Phone:  [phone], Fax:  [phone] Hours of Operation:  9 am - 6 pm, M-F.  Also accepts Medicaid/Medicare and self-pay.  [facility]  [address], [place] Phone: [phone], Fax: [phone]. Hours of Operation:  8:30 am - 5:30 pm, M-F.  Also accepts Medicaid and self-pay.  [facility] [place] [address] [place] Phone: [phone]   [facility] [address] Natasha Bence [place], [place] [phone], Ext. [phone] Mondays & Thursdays: 7-9 AM.  First 15 patients are seen on a first come, first serve basis.    Medicaid-accepting [facility] Providers:  Organization         Address  Phone   Notes  [facility] [address], [place] [phone] Also accepts self-pay patients.  [facility] [address] Laurell Josephs [place], [place]  [phone]   [facility] [phone]  [address], [place] [phone]   [facility] [address] [place] [phone]   Renaye Rakers [address], [place]   [phone] Only accepts [place] Access  [place] patients after they have their name applied to their card.   Self-Pay (no insurance) in [facility]:  Organization         Address  Phone   Notes  Sickle Cell Patients, [facility] [address] [place], [place] [phone]   [facility] [address] [place], [place] [phone]   [facility] [place]  [address],  [phone]   [facility]/Dr. Osei-Bonsu  [address], [place] or [address], [place] [phone] Phone number for both [place] and [place] locations is the same.  [facility] and [facility] [address] [place] [phone]   [facility] [address], [place] or [address] [phone]   [facility] [address] [place] [phone], phone; [phone], fax Sees patients 1st and 3rd Saturday of every month.  Must not qualify for public or private insurance (i.e. Medicaid, Medicare, [place] Health Choice, Veterans' Benefits)  Household income should be no more than 200% of the poverty level The clinic cannot treat you if you are pregnant or think you are pregnant  Sexually transmitted diseases are not treated at the clinic.    Dental Care: Organization         Address  Phone  Notes  [facility] [address] [place], [place] [phone] Accepts children up to age 85 who are enrolled in [place] or [place] Health Choice; pregnant women with a Medicaid card; and children who have applied for Medicaid or [place] Health Choice, but were declined, whose parents can pay a reduced fee at time of service.  [facility] of [facility]  [address], [place] [phone] Accepts children up to age 49 who are enrolled in [place] or [place] Health Choice; pregnant women with a Medicaid  card; and children who have applied for Medicaid or  Health Choice, but were declined, whose parents can pay a reduced fee at time of service.  [facility]  [address] [place], [place] [phone] Patients are seen by appointment only. Walk-ins are not accepted. [facility] will see patients 62 years of age and older. Monday - Tuesday (8am-5pm) Most Wednesdays (8:30-5pm) $30 per visit, cash only  [facility]  [address], [facility] [phone] Patients are seen by appointment only. Walk-ins are not accepted. [facility] will see patients 66 years of age and older. One Wednesday Evening (Monthly: Volunteer Based).  $30 per visit, cash only  [employer]  [phone] for adults; Children under age 36, call Graduate Pediatric Dentistry at [phone]. Children aged 53-14, please call [phone] to request a pediatric application.  Dental services are provided in all areas of dental care including fillings, crowns and bridges, complete and partial dentures, implants, gum treatment, root canals, and extractions. Preventive care is also provided. Treatment is provided to both adults and children. Patients are selected via a lottery and there is often a waiting list.   [facility]  [address], [place] Otto  [phone] [email or web address]   [facility] [address] [place], [place] [phone], Ext. [phone] Second and Fourth Thursday of each month, opens at 6:30 AM; Clinic ends at 9 AM.  Patients are seen on a first-come first-served basis, and a limited number are seen during each clinic.   [facility]  [address] Ether Griffins [place], [place] [phone]   Eligibility Requirements You must have lived in [place], [place], or [place] counties for at least the last three months.   You cannot be eligible for state or federal sponsored [place],  including [employer], [place], or [employer].   You generally cannot be eligible for healthcare insurance through your employer.    How to apply: Eligibility screenings are held every Tuesday and Wednesday afternoon from 1:00 pm until 4:00 pm. You do not need an appointment for the interview!  [facility] [address] [place], [place] [phone]   [facility] Health Department  [phone]   [facility] Health Department  [phone]   [facility] Health Department  [phone]    Behavioral Health Resources in the Community: Intensive Outpatient Programs Organization         Address  Phone  Notes  [facility] [address], [place], [place] [phone]   [facility] Outpatient [address] [place], [place] [phone]   [facility] [address] [place], [place]  [phone]   [facility] [address]  [place], [place] [phone] or [phone]   Substance Abuse Resources Organization         Address  Phone  Notes  [facility] Drug [facility]  [phone]   [facility]  [phone]   [facility]  [phone]   Floydene Flock  [phone]   Residential & Outpatient Substance Abuse Program  [phone]   Psychological Services Organization         Address  Phone  Notes  [facility] Behavioral Health  [phone]   [facility]  [phone]   [facility] [address] [place] [phone] or [phone]    Mobile Crisis Teams Organization         Address  Phone  Notes  [facility], Mobile Crisis Care Unit  [phone]   [facility]  [address] [place], [place] [phone]   Doristine Locks [address] [place] [phone]    Self-Help/Support Groups Organization         Address  Phone             Notes  [facility]oc[facility]  of [place] - variety of support groups  336- I7437963 Call for more information  Narcotics Anonymous (NA), [facility] [address], [employer] [place]  2 meetings at this location   Statistician         Address  Phone  Notes  [facility] [address],    [place]  [phone]   [facility]  [address], [place] [address], [place], [place] [phone]   [facility] [address] [place], [place] [phone] Admissions: 8am-3pm M-F  [facility] [address],    [place], [place] [phone]   [facility] [address] Starling Manns [place], [place] [phone]   [facility] [address],  [place],  [place] [phone]   [facility] - Intensive Outpatient [address] Laurell Joseph[address], [place], [place] [phone]   [facility] ([facility] [address] [place]  [place], [place] [phone] or [phone]   [facility] ([facility]) [address] [place], [place] [phone] Accepts Medicaid  [facility] [address]  [place] [phone] Substance Abuse/Addiction Treatment   [facility] Organization         Address  Phone  Notes  [facility]  [phone]   Angie Fava, PhD [address] Ervin Knack [place], [place]   [phone] or [phone]   [facility]   [address] [place], [place] [phone]   [facility] [address], [place], [place] [phone] Insurance/Medicaid/sponsorship through [facility] Families [address]                                    [place], [place] [phone] Therapy/tele-psych/case  [facility] [address], [place] [phone]    Dr. Lolly Mustache  [phone]   [facility]  [employer] [facility] Dept. 1) [address] [place] 2)  [address], [place] 3)  [address], [place] [phone]  [phone]   [facility] Child Abuse Hotline [phone])405-0336 or [phone] (After Hours)

## 2015-10-13 NOTE — ED Notes (Signed)
Declined W/C at D/C and was escorted to lobby by RN. 

## 2015-10-23 ENCOUNTER — Emergency Department (HOSPITAL_COMMUNITY)
Admission: EM | Admit: 2015-10-23 | Discharge: 2015-10-23 | Disposition: A | Discharge status: Home / Self Care | Payer: Self-pay | Attending: Physician Assistant | Admitting: Physician Assistant

## 2015-10-23 ENCOUNTER — Encounter (HOSPITAL_COMMUNITY): Payer: Self-pay | Admitting: Emergency Medicine

## 2015-10-23 DIAGNOSIS — F17200 Nicotine dependence, unspecified, uncomplicated: Secondary | ICD-10-CM | POA: Insufficient documentation

## 2015-10-23 DIAGNOSIS — Z4802 Encounter for removal of sutures: Secondary | ICD-10-CM | POA: Insufficient documentation

## 2015-10-23 DIAGNOSIS — Z79899 Other long term (current) drug therapy: Secondary | ICD-10-CM | POA: Insufficient documentation

## 2015-10-23 NOTE — ED Provider Notes (Signed)
CSN: [id number]     Arrival date & time 10/23/15  1521 History  By signing my name below, I, Lyndel Safe, attest that this documentation has been prepared under the direction and in the presence of [employer], PA-C. Electronically Signed: Lyndel Safe, ED Scribe. 10/23/2015. 4:32 PM.   Chief Complaint  Patient presents with  . Wound Check   The history is provided by the patient. No language interpreter was used.   HPI Comments: Chris Gray is a 35 y.o. male, with no pertinent history, who presents to the Emergency Department for suture removal of sutures to the volar aspect of left third digit. The pt was evaluated 10 days ago in the ED after sustaining a 3cm laceration to left third finger when using a razor blade at work. On this past visit 3 sutures were placed to the volar aspect of left third finger. He has been cleaning the wound with hydrogen peroxide and rubbing alcohol every other day. Pt is right hand dominant. He has no other complaints.   History reviewed. No pertinent past medical history. Past Surgical History  Procedure Laterality Date  . Ankle surgery Right     PT reports pins and screws are present   No family history on file. Social History  Substance Use Topics  . Smoking status: Current Every Day Smoker  . Smokeless tobacco: None  . Alcohol Use: Yes    Review of Systems  A complete 10 system review of systems was obtained and is otherwise negative except at noted in the HPI and PMH.  Allergies  Review of patient's allergies indicates no known allergies.  Home Medications   Prior to Admission medications   Medication Sig Start Date End Date Taking? Authorizing Provider  diclofenac (VOLTAREN) 75 MG EC tablet Take 1 tablet (75 mg total) by mouth 2 (two) times daily as needed. 03/06/14   Rodolph Bong, MD  ibuprofen (ADVIL,MOTRIN) 200 MG tablet Take 200 mg by mouth every 6 (six) hours as needed.    Historical Provider, MD  ibuprofen (ADVIL,MOTRIN)  600 MG tablet Take 1 tablet (600 mg total) by mouth every 6 (six) hours as needed for mild pain or moderate pain. 01/30/15   Mathis Fare Presson, PA  lidocaine (XYLOCAINE) 2 % solution Apply to affected area using cotton swab every 3 hours as needed for pain 01/30/15   Mathis Fare Presson, PA  terbinafine (LAMISIL) 250 MG tablet Take 1 tablet (250 mg total) by mouth daily. 10/03/15   Marlon Pel, PA-C  traMADol (ULTRAM) 50 MG tablet Take 1 tablet (50 mg total) by mouth every 12 (twelve) hours as needed for moderate pain or severe pain. 10/03/15   Tiffany Neva Seat, PA-C   BP 149/89 mmHg  Pulse 107  Temp(Src) 99.1 F (37.3 C) (Oral)  Resp 20  SpO2 100% Physical Exam  Constitutional: He is oriented to person, place, and time. He appears well-developed and well-nourished. No distress.  HENT:  Head: Normocephalic.  Eyes: Conjunctivae are normal.  Neck: Normal range of motion. Neck supple.  Cardiovascular: Normal rate.   Pulmonary/Chest: Effort normal. No respiratory distress.  Musculoskeletal: Normal range of motion.  Neurological: He is alert and oriented to person, place, and time. Coordination normal.  Skin: Skin is warm.  3x 4-0 prolene sutures in place to the volar aspect of the left third finger; wound is clean, dry and intact.   Psychiatric: He has a normal mood and affect. His behavior is normal.  Nursing  note and vitals reviewed.   ED Course  .Suture Removal Date/Time: 10/23/2015 8:45 PM Performed by: Wynetta EmeryPISCIOTTA, Bryant Saye Authorized by: Wynetta EmeryPISCIOTTA, Octavius Shin Consent: Verbal consent obtained. Consent given by: patient Required items: required blood products, implants, devices, and special equipment available Wound Appearance: clean Sutures Removed: 3 Post-removal: dressing applied Facility: sutures placed in this facility Patient tolerance: Patient tolerated the procedure well with no immediate complications    DIAGNOSTIC STUDIES: Oxygen Saturation is 100% on RA, normal by  my interpretation.    COORDINATION OF CARE: 4:20 PM Discussed treatment plan which includes to remove sutures with pt. Pt acknowledges and agrees to plan.    MDM   Final diagnoses:  Visit for suture removal    Filed Vitals:   10/23/15 1547  BP: 149/89  Pulse: 107  Temp: 99.1 F (37.3 C)  TempSrc: Oral  Resp: 20  SpO2: 100%    Chris Gray is 35 y.o. male presenting for suture removal. Clean dry and intact, could be healing quicker if patient would stop applying rubbing alcohol and hydrogen peroxide. Counseled patient on wound care and return precautions  Evaluation does not show pathology that would require ongoing emergent intervention or inpatient treatment. Pt is hemodynamically stable and mentating appropriately. Discussed findings and plan with patient/guardian, who agrees with care plan. All questions answered. Return precautions discussed and outpatient follow up given.    I personally performed the services described in this documentation, which was scribed in my presence. The recorded information has been reviewed and is accurate.    Wynetta Emeryicole Idonia Zollinger, PA-C 10/23/15 2046  Courteney Randall AnLyn Mackuen, MD 10/24/15 0020

## 2015-10-23 NOTE — ED Notes (Signed)
Pt cut his finger 1 week ago. Pt has 3 stiches to be removed.

## 2015-10-23 NOTE — ED Notes (Signed)
Pt stable, ambulatory, states understanding of discharge instructions 

## 2015-10-23 NOTE — ED Notes (Signed)
Suture removal kit at bedside 

## 2015-10-23 NOTE — Discharge Instructions (Signed)

## 2016-02-06 ENCOUNTER — Emergency Department (HOSPITAL_COMMUNITY): Payer: Self-pay

## 2016-02-06 ENCOUNTER — Emergency Department (HOSPITAL_COMMUNITY)
Admission: EM | Admit: 2016-02-06 | Discharge: 2016-02-06 | Disposition: A | Discharge status: Home / Self Care | Payer: Self-pay | Attending: Emergency Medicine | Admitting: Emergency Medicine

## 2016-02-06 ENCOUNTER — Encounter (HOSPITAL_COMMUNITY): Payer: Self-pay | Admitting: Emergency Medicine

## 2016-02-06 DIAGNOSIS — Y9289 Other specified places as the place of occurrence of the external cause: Secondary | ICD-10-CM | POA: Insufficient documentation

## 2016-02-06 DIAGNOSIS — R21 Rash and other nonspecific skin eruption: Secondary | ICD-10-CM | POA: Insufficient documentation

## 2016-02-06 DIAGNOSIS — Y998 Other external cause status: Secondary | ICD-10-CM | POA: Insufficient documentation

## 2016-02-06 DIAGNOSIS — Y9389 Activity, other specified: Secondary | ICD-10-CM | POA: Insufficient documentation

## 2016-02-06 DIAGNOSIS — W1839XA Other fall on same level, initial encounter: Secondary | ICD-10-CM | POA: Insufficient documentation

## 2016-02-06 DIAGNOSIS — Z79899 Other long term (current) drug therapy: Secondary | ICD-10-CM | POA: Insufficient documentation

## 2016-02-06 DIAGNOSIS — S86912A Strain of unspecified muscle(s) and tendon(s) at lower leg level, left leg, initial encounter: Secondary | ICD-10-CM | POA: Insufficient documentation

## 2016-02-06 DIAGNOSIS — F17200 Nicotine dependence, unspecified, uncomplicated: Secondary | ICD-10-CM | POA: Insufficient documentation

## 2016-02-06 MED ORDER — KETOROLAC TROMETHAMINE 60 MG/2ML IM SOLN
30.0000 mg | Freq: Once | INTRAMUSCULAR | Status: AC
  Administered 2016-02-06: 30 mg via INTRAMUSCULAR
  Filled 2016-02-06: qty 2

## 2016-02-06 MED ORDER — NAPROXEN 500 MG PO TABS: 500.0000 mg | ORAL_TABLET | Freq: Two times a day (BID) | ORAL | Status: DC

## 2016-02-06 MED ORDER — HYDROXYZINE HCL 25 MG PO TABS: 25.0000 mg | ORAL_TABLET | Freq: Four times a day (QID) | ORAL | Status: DC | PRN

## 2016-02-06 MED ORDER — TRIAMCINOLONE ACETONIDE 0.1 % EX CREA: 1.0000 "application " | TOPICAL_CREAM | Freq: Two times a day (BID) | CUTANEOUS | Status: DC

## 2016-02-06 MED ORDER — ACETAMINOPHEN 500 MG PO TABS
1000.0000 mg | ORAL_TABLET | Freq: Once | ORAL | Status: AC
  Administered 2016-02-06: 1000 mg via ORAL
  Filled 2016-02-06: qty 2

## 2016-02-06 NOTE — Discharge Instructions (Signed)
Your x-ray was normal. Take naproxen as needed for pain. Keep your leg elevated when resting at home. Please call Dr. Magdalene Patricia office to schedule an orthopedic follow up appointment.  I will also give you a cream to try for your rash. I gave you some pills to help with the itching. Take medications as prescribed. Return to the emergency room for worsening condition or new concerning symptoms. Follow up with your regular doctor. If you don't have a regular doctor use one of the numbers below to establish a primary care doctor.   Emergency Department Resource Guide 1) Find a Doctor and Pay Out of Pocket Although you won't have to find out who is covered by your insurance plan, it is a good idea to ask around and get recommendations. You will then need to call the office and see if the doctor you have chosen will accept you as a new patient and what types of options they offer for patients who are self-pay. Some doctors offer discounts or will set up payment plans for their patients who do not have insurance, but you will need to ask so you aren't surprised when you get to your appointment.  2) Contact Your Local Health Department Not all health departments have doctors that can see patients for sick visits, but many do, so it is worth a call to see if yours does. If you don't know where your local health department is, you can check in your phone book. The CDC also has a tool to help you locate your state's health department, and many state websites also have listings of all of their local health departments.  3) Find a Walk-in Clinic If your illness is not likely to be very severe or complicated, you may want to try a walk in clinic. These are popping up all over the country in pharmacies, drugstores, and shopping centers. They're usually staffed by nurse practitioners or physician assistants that have been trained to treat common illnesses and complaints. They're usually fairly quick and inexpensive.  However, if you have serious medical issues or chronic medical problems, these are probably not your best option.  No Primary Care Doctor: - Call Health Connect at  [phone] - they can help you locate a primary care doctor that  accepts your insurance, provides certain services, etc. - Physician Referral [phone]  Emergency Department Resource Guide 1) Find a Doctor and Pay Out of Pocket Although you won't have to find out who is covered by your insurance plan, it is a good idea to ask around and get recommendations. You will then need to call the office and see if the doctor you have chosen will accept you as a new patient and what types of options they offer for patients who are self-pay. Some doctors offer discounts or will set up payment plans for their patients who do not have insurance, but you will need to ask so you aren't surprised when you get to your appointment.  2) Contact Your Local Health Department Not all health departments have doctors that can see patients for sick visits, but many do, so it is worth a call to see if yours does. If you don't know where your local health department is, you can check in your phone book. The CDC also has a tool to help you locate your state's health department, and many state websites also have listings of all of their local health departments.  3) Find a Walk-in Clinic If your illness is  not likely to be very severe or complicated, you may want to try a walk in clinic. These are popping up all over the country in pharmacies, drugstores, and shopping centers. They're usually staffed by nurse practitioners or physician assistants that have been trained to treat common illnesses and complaints. They're usually fairly quick and inexpensive. However, if you have serious medical issues or chronic medical problems, these are probably not your best option.  No Primary Care Doctor: - Call Health Connect at  [phone] - they can help you locate  a primary care doctor that  accepts your insurance, provides certain services, etc. - Physician Referral Service- [phone]  Chronic Pain Problems: Organization         Address  Phone   Notes  [facility]  [phone]) [phone] Patients need to be referred by their primary care doctor.   Medication Assistance: Organization         Address  Phone   Notes  [facility] [address] [place], [place] [address] [phone] --Must be a resident of [facility] [place] -- Must have NO insurance coverage whatsoever (no Medicaid/ Medicare, etc.) -- The pt. MUST have a primary care doctor that directs their care regularly and follows them in the community   MedAssist  [phone]   [employer]  [phone]) [phone]    Agencies that provide inexpensive medical care: Organization         Address  Phone   Notes  [facility]  [phone]   [facility]    [phone]   [facility] [address] [place], [place] [address] [phone]) [phone]   [facility] [address], [place] [phone]   [facility]    [phone]   [facility]    [phone]   [facility]  [address], [place] Phone:  [phone], Fax:  [phone] Hours of Operation:  9 am - 6 pm, M-F.  Also accepts Medicaid/Medicare and self-pay.  [facility]  [address], [place] Phone: [phone]) [phone], Fax: [phone]) [phone]. Hours of Operation:  8:30 am - 5:30 pm, M-F.  Also accepts Medicaid and self-pay.  [facility] [place] [address], [place] Phone: [phone](336) [phone]   [facility] [address], [place], [place] [phone], Ext. [phone] Mondays & Thursdays: 7-9 AM.  First 15 patients are seen on a first come, first serve basis.    Medicaid-accepting [facility]  Providers:  Organization         Address  Phone   Notes  [facility] [address], [place] [phone]) [phone] Also accepts self-pay patients.  [facility] [address], [place]  [phone]   [facility] [address], [place] [phone]   [facility] [address] [place] [address], [place] [phone]   Renaye RakersVeita Bland [address], [place]   [phone] Only accepts [place] patients after they have their name applied to their card.   Self-Pay (no insurance) in [facility]:  Organization         Address  Phone   Notes  Sickle Cell Patients, [facility] [address], [place] [phone]   [facility] [address] [place], [place] [phone]   [facility] [place]  [address], [place] [phone]   [facility]/Dr. Osei-Bonsu  [address], [place] or [address], [place] [phone] Phone number for both [place] and [place] locations is the same.  [facility] [address] [place] [phone]   [facility] [address] [place] or [address] [phone]   [facility] [address], [place] [phone], phone; [phone], fax Sees patients 1st and 3rd Saturday of every month.  Must not qualify for public or private insurance (i.e. Medicaid, Medicare,  Health Choice, Veterans' Benefits)  Household income should be no more than 200% of the poverty level The clinic cannot treat you if you are pregnant or think you are pregnant  Sexually transmitted diseases are not treated at the clinic.

## 2016-02-06 NOTE — Progress Notes (Signed)
Orthopedic Tech Progress Note Patient Details:  Maude LericheZachary A Tull 06/08/1981 [id number]  Ortho Devices Type of Ortho Device: Ace wrap, Crutches Ortho Device/Splint Interventions: Application   Saul FordyceJennifer C Destiny Hagin 02/06/2016, 6:48 PM

## 2016-02-06 NOTE — ED Notes (Signed)
Pt st's he fell yesterday landing on left knee.  Pt c/o pain to inner left knee

## 2016-02-06 NOTE — ED Notes (Signed)
Pt to xray at this time.

## 2016-02-06 NOTE — ED Provider Notes (Signed)
CSN: [id number]     Arrival date & time 02/06/16  1701 History  By signing my name below, I, Placido SouLogan Joldersma, attest that this documentation has been prepared under the direction and in the presence of Stuart Mirabile Y Schylar Allard, [place]-C. Electronically Signed: Placido SouLogan Joldersma, ED Scribe. 02/06/2016. 5:51 PM.   Chief Complaint  Patient presents with  . Knee Injury   The history is provided by the patient. No language interpreter was used.   HPI Comments: Chris Gray is a 35 y.o. male who presents to the Emergency Department complaining of a fall that occurred 1 day ago. He states he accidentally slipped and believes that his left knee twisted but is unsure due to being intoxicated at the time. He reports associated, moderate, left knee pain that worsens with movement or palpation. He denies taking anything for pain management. He works in [employer] and stands for long periods of time.   He additionally complains of constant, mild, bilateral hand and feet irritation onset for a few months. Pt states that he has been seen for similar symptoms in the past and was d/c with a topical ointment which has not provided any significant relief. He reports associated, mild, pain in the affected region. Pt denies any other associated symptoms at this time.   No past medical history on file. Past Surgical History  Procedure Laterality Date  . Ankle surgery Right     PT reports pins and screws are present   No family history on file. Social History  Substance Use Topics  . Smoking status: Current Every Day Smoker  . Smokeless tobacco: Not on file  . Alcohol Use: Yes    Review of Systems A complete 10 system review of systems was obtained and all systems are negative except as noted in the HPI and PMH.   Allergies  Review of patient's allergies indicates no known allergies.  Home Medications   Prior to Admission medications   Medication Sig Start Date End Date Taking? Authorizing Provider  diclofenac  (VOLTAREN) 75 MG EC tablet Take 1 tablet (75 mg total) by mouth 2 (two) times daily as needed. 03/06/14   Rodolph BongEvan S Corey, MD  ibuprofen (ADVIL,MOTRIN) 200 MG tablet Take 200 mg by mouth every 6 (six) hours as needed.    Historical Provider, MD  ibuprofen (ADVIL,MOTRIN) 600 MG tablet Take 1 tablet (600 mg total) by mouth every 6 (six) hours as needed for mild pain or moderate pain. 01/30/15   Mathis FareJennifer Lee H Presson, PA  lidocaine (XYLOCAINE) 2 % solution Apply to affected area using cotton swab every 3 hours as needed for pain 01/30/15   Mathis FareJennifer Lee H Presson, PA  terbinafine (LAMISIL) 250 MG tablet Take 1 tablet (250 mg total) by mouth daily. 10/03/15   Marlon Peliffany Greene, PA-C  traMADol (ULTRAM) 50 MG tablet Take 1 tablet (50 mg total) by mouth every 12 (twelve) hours as needed for moderate pain or severe pain. 10/03/15   Tiffany Neva SeatGreene, PA-C   BP 167/96 mmHg  Pulse 113  Temp(Src) 98.1 F (36.7 C) (Oral)  Resp 16  SpO2 100%    Physical Exam  Constitutional: He is oriented to person, place, and time. He appears well-developed and well-nourished.  HENT:  Head: Normocephalic and atraumatic.  Eyes: EOM are normal.  Neck: Normal range of motion.  Cardiovascular: Normal rate.   Pulmonary/Chest: Effort normal. No respiratory distress.  Abdominal: Soft.  Musculoskeletal: Normal range of motion. He exhibits tenderness.  TTP to the medial  aspect of the left knee; pain with valgus movement  Neurological: He is alert and oriented to person, place, and time.  Skin: Skin is warm and dry.  Diffuse scaling, dry, plaque rash on bilateral dorsum of hands particularly around knuckles  Psychiatric: He has a normal mood and affect.  Nursing note and vitals reviewed.   ED Course  Procedures  DIAGNOSTIC STUDIES: Oxygen Saturation is 100% on RA, normal by my interpretation.    COORDINATION OF CARE: 5:46 PM Discussed next steps with pt. He verbalized understanding and is agreeable with the plan.   Labs  Review Labs Reviewed - No data to display  Imaging Review Dg Knee Complete 4 Views Left  02/06/2016  CLINICAL DATA:  Left anterior knee pain after slip on water yesterday. Pain with weight-bearing and bending. EXAM: LEFT KNEE - COMPLETE 4+ VIEW COMPARISON:  None. FINDINGS: There is no evidence of fracture, dislocation, or joint effusion. There is no evidence of arthropathy or other focal bone abnormality. Soft tissues are unremarkable. IMPRESSION: Negative radiographs of the left knee. Electronically Signed   By: Rubye Oaks M.D.   On: 02/06/2016 18:15   I have personally reviewed and evaluated these images as part of my medical decision-making.   EKG Interpretation None      MDM   Final diagnoses:  Knee strain, left, initial encounter  Rash of hands    Patient X-Ray negative for obvious fracture or dislocation.  Pt advised to follow up with orthopedics. Patient given ACE wrap and crutches while in ED, conservative therapy recommended and discussed. Will also treat rash with topical kenalog cream as rash appears inflammatory/eczematous. Patient will be discharged home & is agreeable with above plan. Returns precautions discussed. Pt appears safe for discharge.  I personally performed the services described in this documentation, which was scribed in my presence. The recorded information has been reviewed and is accurate.   Carlene Coria, PA-C 02/06/16 2215

## 2016-04-22 NOTE — ED Provider Notes (Signed)
Medical screening examination/treatment/procedure(s) were performed by non-physician practitioner and as supervising physician I was immediately available for consultation/collaboration.   EKG Interpretation None       Jacalyn LefevreJulie Gurkaran Rahm, MD 04/22/16 1247

## 2017-06-04 IMAGING — DX DG KNEE COMPLETE 4+V*L*
4 series · 4 of 4 positions shown · non-contrast
Comparison: None.

CLINICAL DATA: Left anterior knee pain after slip on water
yesterday. Pain with weight-bearing and bending.

EXAM:
LEFT KNEE - COMPLETE 4+ VIEW

[t knee ap left]
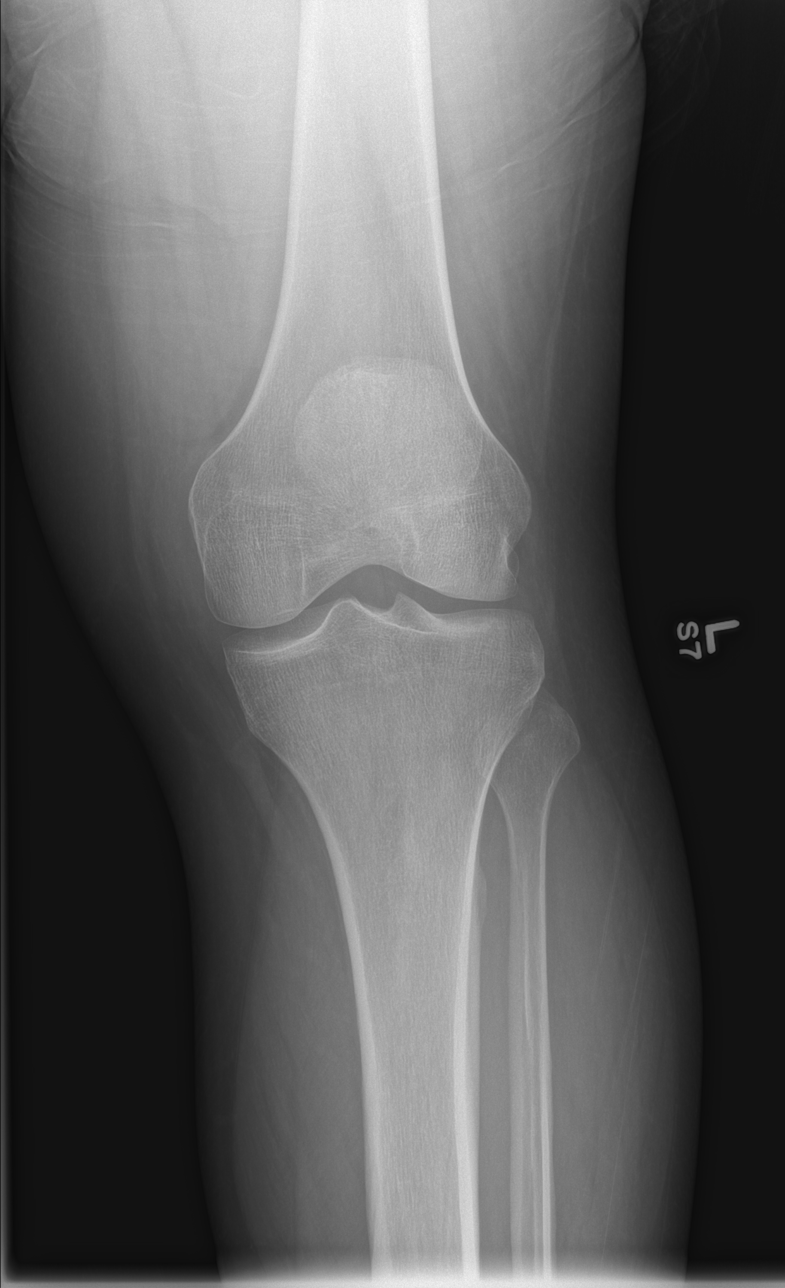

[t knee obl left (1 of 2)]
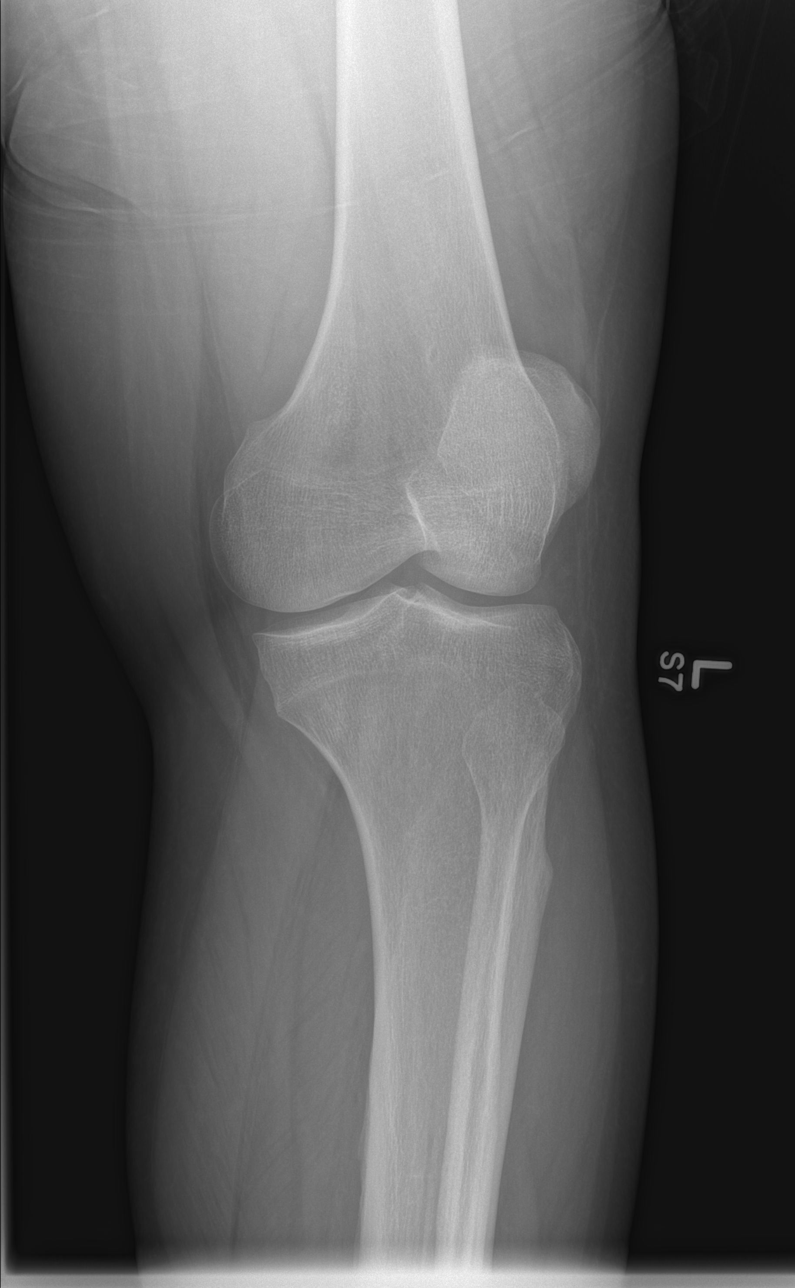

[t knee obl left (2 of 2)]
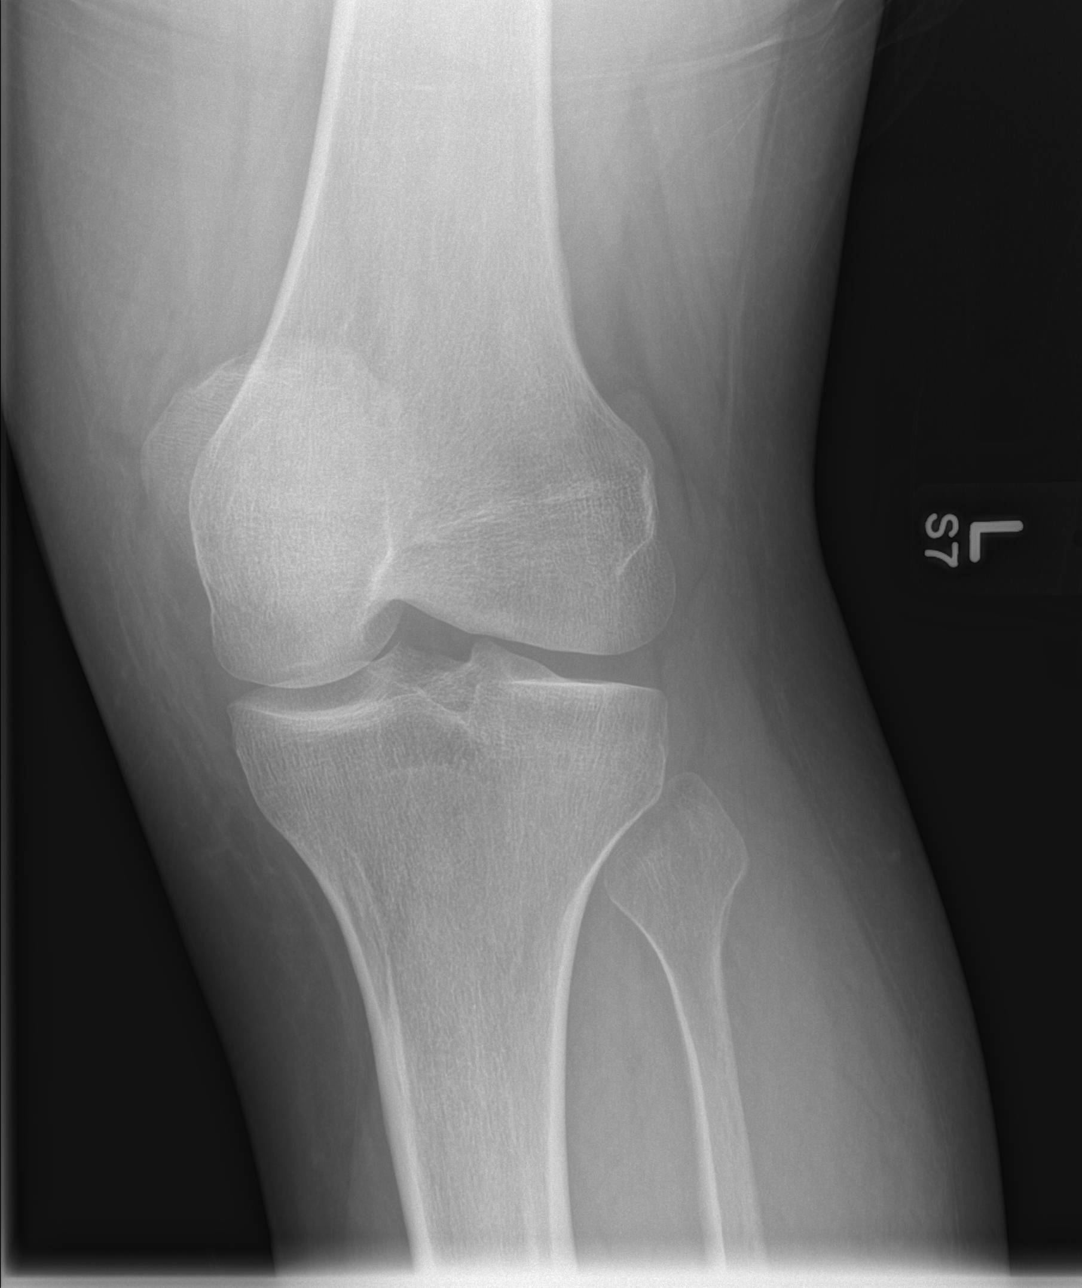

[t knee lat left]
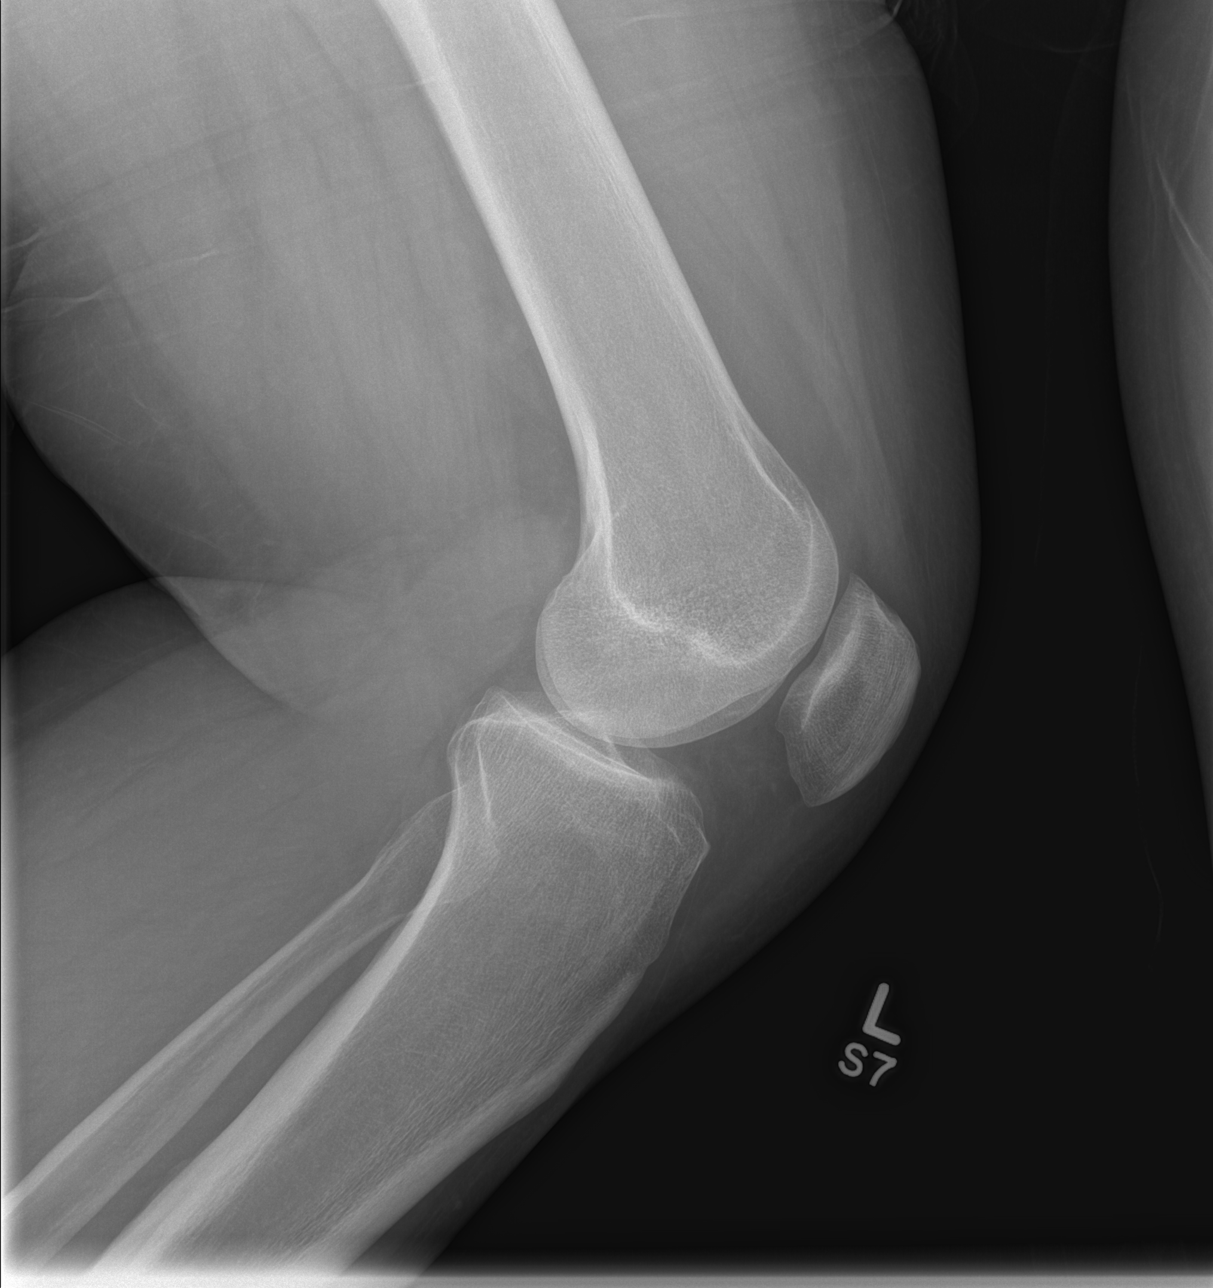

[4 of 4 positions shown; findings below may reference images not displayed]

FINDINGS: There is no evidence of fracture, dislocation, or joint effusion.
There is no evidence of arthropathy or other focal bone abnormality.
Soft tissues are unremarkable.
IMPRESSION: Negative radiographs of the left knee.

## 2017-06-22 ENCOUNTER — Encounter (HOSPITAL_COMMUNITY): Payer: Self-pay | Admitting: *Deleted

## 2017-06-22 ENCOUNTER — Emergency Department (HOSPITAL_COMMUNITY)
Admission: EM | Admit: 2017-06-22 | Discharge: 2017-06-22 | Disposition: A | Discharge status: Home / Self Care | Payer: Self-pay | Attending: Emergency Medicine | Admitting: Emergency Medicine

## 2017-06-22 DIAGNOSIS — Z79899 Other long term (current) drug therapy: Secondary | ICD-10-CM | POA: Insufficient documentation

## 2017-06-22 DIAGNOSIS — I10 Essential (primary) hypertension: Secondary | ICD-10-CM | POA: Insufficient documentation

## 2017-06-22 DIAGNOSIS — F17200 Nicotine dependence, unspecified, uncomplicated: Secondary | ICD-10-CM | POA: Insufficient documentation

## 2017-06-22 DIAGNOSIS — R739 Hyperglycemia, unspecified: Secondary | ICD-10-CM | POA: Insufficient documentation

## 2017-06-22 DIAGNOSIS — L232 Allergic contact dermatitis due to cosmetics: Secondary | ICD-10-CM | POA: Insufficient documentation

## 2017-06-22 LAB — CBG MONITORING, ED: Glucose-Capillary: 158 mg/dL — ABNORMAL HIGH (ref 65–99)

## 2017-06-22 MED ORDER — TRIAMCINOLONE ACETONIDE 0.5 % EX OINT
TOPICAL_OINTMENT | Freq: Once | CUTANEOUS | Status: AC
  Administered 2017-06-22: 1 via TOPICAL
  Filled 2017-06-22: qty 15

## 2017-06-22 MED ORDER — HYDROXYZINE HCL 25 MG PO TABS: 25.0000 mg | ORAL_TABLET | Freq: Four times a day (QID) | ORAL | 0 refills | Status: DC

## 2017-06-22 MED ORDER — TRIAMCINOLONE ACETONIDE 0.5 % EX OINT: 1.0000 "application " | TOPICAL_OINTMENT | Freq: Two times a day (BID) | CUTANEOUS | 1 refills | Status: DC

## 2017-06-22 NOTE — ED Triage Notes (Signed)
Pt is here with couple days of hives to different areas of his body and states it burns and itches.  Now most problem area is under both axillas, red/irritated

## 2017-06-22 NOTE — ED Provider Notes (Signed)
[facility] Provider Note   CSN: [id number] Arrival date & time: 06/22/17  1516     History   Chief Complaint Chief Complaint  Patient presents with  . Urticaria    HPI Chris Gray is a 36 y.o. male HPI He presents to the emergency department with chief complaint of rash. Patient states that he useddeodorant for the first time afterward he developed a burning and itching rash under both armpits. He has a history of eczema. He denies any other complaints at this time. History reviewed. No pertinent past medical history.  There are no active problems to display for this patient.   Past Surgical History:  Procedure Laterality Date  . ANKLE SURGERY Right    PT reports pins and screws are present       Home Medications    Prior to Admission medications   Medication Sig Start Date End Date Taking? Authorizing Provider  diclofenac (VOLTAREN) 75 MG EC tablet Take 1 tablet (75 mg total) by mouth 2 (two) times daily as needed. 03/06/14   Rodolph Bong, MD  hydrOXYzine (ATARAX/VISTARIL) 25 MG tablet Take 1 tablet (25 mg total) by mouth every 6 (six) hours. 06/22/17   Arthor Captain, PA-C  ibuprofen (ADVIL,MOTRIN) 200 MG tablet Take 200 mg by mouth every 6 (six) hours as needed.    [provider]  ibuprofen (ADVIL,MOTRIN) 600 MG tablet Take 1 tablet (600 mg total) by mouth every 6 (six) hours as needed for mild pain or moderate pain. 01/30/15   Presson, Mathis Fare, PA  lidocaine (XYLOCAINE) 2 % solution Apply to affected area using cotton swab every 3 hours as needed for pain 01/30/15   Presson, Jess Barters H, PA  naproxen (NAPROSYN) 500 MG tablet Take 1 tablet (500 mg total) by mouth 2 (two) times daily. 02/06/16   Sam, Ace Gins, PA-C  terbinafine (LAMISIL) 250 MG tablet Take 1 tablet (250 mg total) by mouth daily. 10/03/15   Marlon Pel, PA-C  traMADol (ULTRAM) 50 MG tablet Take 1 tablet (50 mg total) by mouth every 12 (twelve) hours as needed for  moderate pain or severe pain. 10/03/15   Marlon Pel, PA-C  triamcinolone ointment (KENALOG) 0.5 % Apply 1 application topically 2 (two) times daily. 06/22/17   Arthor Captain, PA-C    Family History No family history on file.  Social History Social History  Substance Use Topics  . Smoking status: Current Every Day Smoker  . Smokeless tobacco: Never Used  . Alcohol use Yes     Comment: QOD     Allergies   Patient has no known allergies.   Review of Systems Review of Systems  Constitutional: Negative for chills and fever.  Skin: Positive for rash.     Physical Exam Updated Vital Signs BP (!) 173/116 (BP Location: Left Wrist) Comment: PA aware, pt to follow up with PCP  Pulse 84   Temp 98.2 F (36.8 C) (Oral)   Resp 18   SpO2 100%   Physical Exam  Constitutional: He appears well-developed and well-nourished. No distress.  HENT:  Head: Normocephalic and atraumatic.   Moon Facies  Eyes: Conjunctivae are normal. No scleral icterus.  Neck: Normal range of motion. Neck supple.  Buffalo hump  Cardiovascular: Normal rate, regular rhythm and normal heart sounds.   Pulmonary/Chest: Effort normal and breath sounds normal. No respiratory distress.  Abdominal: Soft. There is no tenderness.  Rotund abdomen with striae  Musculoskeletal: He exhibits no edema.  Neurological: He is alert.  Skin: Skin is warm and dry. He is not diaphoretic.  Striae over the abdomen and arms  Psychiatric: His behavior is normal.  Nursing note and vitals reviewed.    ED Treatments / Results  Labs (all labs ordered are listed, but only abnormal results are displayed) Labs Reviewed  CBG MONITORING, ED - Abnormal; Notable for the following:       Result Value   Glucose-Capillary 158 (*)    All other components within normal limits    EKG  EKG Interpretation None       Radiology No results found.  Procedures Procedures (including critical care time)  Medications Ordered in  ED Medications  triamcinolone ointment (KENALOG) 0.5 % (1 application Topical Given 06/22/17 1721)     Initial Impression / Assessment and Plan / ED Course  I have reviewed the triage vital signs and the nursing notes.  Pertinent labs & imaging results that were available during my care of the patient were reviewed by me and considered in my medical decision making (see chart for details).     Patient contact dermatitis.Patient will be discharged with triamcinolone ointment. I did the patient's appearance of Moon facies and striae day, he also has a Buffalo hump and a acanthosis nigricans. I have concern for Cushing's syndrome. I asked our case manager Anola GurneyWanda Rogers to assure follow-up for this patient. He is to follow-up as directed by case management. Patient appears appropriate for discharge at this time.  Final Clinical Impressions(s) / ED Diagnoses   Final diagnoses:  Allergic contact dermatitis due to cosmetics  Hyperglycemia  Hypertension, unspecified type    New Prescriptions Discharge Medication List as of 06/22/2017  5:24 PM    START taking these medications   Details  triamcinolone ointment (KENALOG) 0.5 % Apply 1 application topically 2 (two) times daily., Starting Wed 06/22/2017, Print         BelpreHarris, TildenAbigail, PA-C 06/23/17 [id number]    Gwyneth SproutPlunkett, Whitney, MD 06/23/17 2132

## 2017-06-22 NOTE — Discharge Instructions (Signed)
Contact a health care provider if: You have a fever or chills. You are struggling to manage your Cushing syndrome symptoms. Get help right away if: You feel very light-headed and weak. You have very bad abdominal pain. You have sudden changes in your vision. You become very confused or disoriented. You have trouble breathing. You have chest pain.

## 2017-07-06 ENCOUNTER — Ambulatory Visit: Payer: Self-pay | Admitting: Family Medicine

## 2017-07-11 ENCOUNTER — Ambulatory Visit (INDEPENDENT_AMBULATORY_CARE_PROVIDER_SITE_OTHER): Payer: Self-pay | Admitting: Family Medicine

## 2017-07-11 ENCOUNTER — Encounter: Payer: Self-pay | Admitting: Family Medicine

## 2017-07-11 VITALS — BP 134/86 | HR 87 | Temp 98.1°F | Resp 16 | Ht 71.0 in | Wt 264.0 lb

## 2017-07-11 DIAGNOSIS — R7309 Other abnormal glucose: Secondary | ICD-10-CM

## 2017-07-11 DIAGNOSIS — R21 Rash and other nonspecific skin eruption: Secondary | ICD-10-CM

## 2017-07-11 DIAGNOSIS — E119 Type 2 diabetes mellitus without complications: Secondary | ICD-10-CM

## 2017-07-11 DIAGNOSIS — L309 Dermatitis, unspecified: Secondary | ICD-10-CM

## 2017-07-11 DIAGNOSIS — R030 Elevated blood-pressure reading, without diagnosis of hypertension: Secondary | ICD-10-CM

## 2017-07-11 MED ORDER — SULFAMETHOXAZOLE-TRIMETHOPRIM 800-160 MG PO TABS: 1.0000 | ORAL_TABLET | Freq: Two times a day (BID) | ORAL | 0 refills | Status: DC

## 2017-07-11 MED FILL — SULFAMETHOXAZOLE-TMP DS TAB: 800-160 | 10 days supply | Qty: 20 | Fill #0

## 2017-07-11 NOTE — Patient Instructions (Signed)
Skin Abscess A skin abscess is an infected area on or under your skin that contains pus and other material. An abscess can happen almost anywhere on your body. Some abscesses break open (rupture) on their own. Most continue to get worse unless they are treated. The infection can spread deeper into the body and into your blood, which can make you feel sick. Treatment usually involves draining the abscess. Follow these instructions at home: Abscess Care  If you have an abscess that has not drained, place a warm, clean, wet washcloth over the abscess several times a day. Do this as told by your doctor.  Follow instructions from your doctor about how to take care of your abscess. Make sure you: ? Cover the abscess with a bandage (dressing). ? Change your bandage or gauze as told by your doctor. ? Wash your hands with soap and water before you change the bandage or gauze. If you cannot use soap and water, use hand sanitizer.  Check your abscess every day for signs that the infection is getting worse. Check for: ? More redness, swelling, or pain. ? More fluid or blood. ? Warmth. ? More pus or a bad smell. Medicines   Take over-the-counter and prescription medicines only as told by your doctor.  If you were prescribed an antibiotic medicine, take it as told by your doctor. Do not stop taking the antibiotic even if you start to feel better. General instructions  To avoid spreading the infection: ? Do not share personal care items, towels, or hot tubs with others. ? Avoid making skin-to-skin contact with other people.  Keep all follow-up visits as told by your doctor. This is important. Contact a doctor if:  You have more redness, swelling, or pain around your abscess.  You have more fluid or blood coming from your abscess.  Your abscess feels warm when you touch it.  You have more pus or a bad smell coming from your abscess.  You have a fever.  Your muscles ache.  You have  chills.  You feel sick. Get help right away if:  You have very bad (severe) pain.  You see red streaks on your skin spreading away from the abscess. This information is not intended to replace advice given to you by your health care provider. Make sure you discuss any questions you have with your health care provider. Document Released: 03/15/2008 Document Revised: 05/23/2016 Document Reviewed: 08/06/2015 Elsevier Interactive Patient Education  2018 Elsevier Inc.  

## 2017-07-11 NOTE — Progress Notes (Signed)
Patient ID: Chris Gray, male    DOB: 03/18/1981, 36 y.o.   MRN: [id number]  PCP: Bing Neighbors, FNP  Chief Complaint  Patient presents with  . Establish Care  . Hospitalization Follow-up    Subjective:  HPI Chris Gray is a 36 y.o. male presents to establish care and hospital follow-up. Medical history significant for eczema, current smoking, and obesity. On 06/22/2017 he presented to [facility] with a complaint of skin rash underneath bilateral armpits which he reports developed after recently using deodorant. He was placed on hydroxyzine and triamcinolone ointment and reports resolution of rash. Chris Gray complains of red raised pus filled lesions along the lower boarder of abdomen , groin region, and left hip. He reports lesion have been present for over 1 month. Lesion are non-itching and non-painful. Reports no recent contact with any known irritants. He has no other complaints today.  Social History   Social History  . Marital status: Single    Spouse name: N/A  . Number of children: N/A  . Years of education: N/A   Occupational History  . Not on file.   Social History Main Topics  . Smoking status: Current Every Day Smoker  . Smokeless tobacco: Never Used  . Alcohol use Yes     Comment: QOD  . Drug use: No  . Sexual activity: Not on file   Other Topics Concern  . Not on file   Social History Narrative  . No narrative on file    Family History  Problem Relation Age of Onset  . Family history unknown: Yes   Review of Systems  Constitutional: Negative.   Respiratory: Negative.   Cardiovascular: Negative.   Musculoskeletal: Negative.   Skin: Positive for color change and rash.  Neurological: Negative.   Psychiatric/Behavioral: Negative.      Prior to Admission medications   Medication Sig Start Date End Date Taking? Authorizing Provider  hydrOXYzine (ATARAX/VISTARIL) 25 MG tablet Take 1 tablet (25 mg total) by mouth every 6 (six) hours.  06/22/17  Yes Adelene Polivka, Abigail, PA-C  triamcinolone ointment (KENALOG) 0.5 % Apply 1 application topically 2 (two) times daily. 06/22/17  Yes Omair Dettmer, Abigail, PA-C  diclofenac (VOLTAREN) 75 MG EC tablet Take 1 tablet (75 mg total) by mouth 2 (two) times daily as needed. Patient not taking: Reported on 07/11/2017 03/06/14   Rodolph Bong, MD  lidocaine (XYLOCAINE) 2 % solution Apply to affected area using cotton swab every 3 hours as needed for pain Patient not taking: Reported on 07/11/2017 01/30/15   Ria Clock, PA  naproxen (NAPROSYN) 500 MG tablet Take 1 tablet (500 mg total) by mouth 2 (two) times daily. Patient not taking: Reported on 07/11/2017 02/06/16   Sam, Ace Gins, PA-C  terbinafine (LAMISIL) 250 MG tablet Take 1 tablet (250 mg total) by mouth daily. Patient not taking: Reported on 07/11/2017 10/03/15   Marlon Pel, PA-C  traMADol (ULTRAM) 50 MG tablet Take 1 tablet (50 mg total) by mouth every 12 (twelve) hours as needed for moderate pain or severe pain. Patient not taking: Reported on 07/11/2017 10/03/15   Marlon Pel, PA-C    Past Medical, Surgical Family and Social History reviewed and updated.    Objective:   Today's Vitals   07/11/17 0827  BP: (!) 144/86  Pulse: 87  Resp: 16  Temp: 98.1 F (36.7 C)  TempSrc: Oral  SpO2: 99%  Weight: 264 lb (119.7 kg)  Height: 5\' 11"  (1.803 m)  Wt Readings from Last 3 Encounters:  07/11/17 264 lb (119.7 kg)  10/13/15 245 lb (111.1 kg)   Physical Exam  Constitutional: He is oriented to person, place, and time. He appears well-developed and well-nourished.  HENT:  Head: Normocephalic and atraumatic.  Eyes: Pupils are equal, round, and reactive to light. Conjunctivae and EOM are normal.  Neck: Normal range of motion. Neck supple.  Cardiovascular: Normal rate, regular rhythm, normal heart sounds and intact distal pulses.   Pulmonary/Chest: Effort normal and breath sounds normal.  Musculoskeletal: Normal range of  motion.  Lymphadenopathy:    He has no cervical adenopathy.  Neurological: He is alert and oriented to person, place, and time.  Skin: Skin is warm and dry. Rash noted. Rash is pustular.  Lower bilateral abdomen, hips and groin region  Papules with defined pustular head.    Psychiatric: He has a normal mood and affect. His behavior is normal. Judgment and thought content normal.   Assessment & Plan:  1. Eczema, unspecified type, resolved. 2. Skin eruption, wound culture pending, will treat empirically with a medication that will cover MRSA infection.  3. Elevated blood pressure reading, if elevated at next follow-up will consider ACE/ARB therapy. 4. Elevated glucose, hemoglobin A1C pending    Meds ordered this encounter  Medications  . sulfamethoxazole-trimethoprim (BACTRIM DS,SEPTRA DS) 800-160 MG tablet    Sig: Take 1 tablet by mouth 2 (two) times daily.    Dispense:  20 tablet    Refill:  0    Order Specific Question:   Supervising Provider    Answer:   Quentin Angst [id number]    Orders Placed This Encounter  Procedures  . WOUND CULTURE  . CBC with Differential  . COMPLETE METABOLIC PANEL WITH GFR  . Thyroid Panel With TSH  . Hemoglobin A1c      RTC: 1 month recheck blood pressure and skin infection.   Godfrey Pick. Tiburcio Pea, MSN, FNP-C [facility]  [address] [place], [place] [address] [phone]

## 2017-07-12 LAB — CBC WITH DIFFERENTIAL/PLATELET
BASOS ABS: 94 {cells}/uL (ref 0–200)
Basophils Relative: 0.9 %
EOS ABS: 198 {cells}/uL (ref 15–500)
Eosinophils Relative: 1.9 %
HEMATOCRIT: 48.9 % (ref 38.5–50.0)
Hemoglobin: 14.8 g/dL (ref 13.2–17.1)
LYMPHS ABS: 2111 {cells}/uL (ref 850–3900)
MCH: 22 pg — AB (ref 27.0–33.0)
MCHC: 30.3 g/dL — AB (ref 32.0–36.0)
MCV: 72.7 fL — AB (ref 80.0–100.0)
MPV: 9.3 fL (ref 7.5–12.5)
Monocytes Relative: 7 %
NEUTROS PCT: 69.9 %
Neutro Abs: 7270 cells/uL (ref 1500–7800)
Platelets: 454 10*3/uL — ABNORMAL HIGH (ref 140–400)
RBC: 6.73 10*6/uL — AB (ref 4.20–5.80)
RDW: 17 % — AB (ref 11.0–15.0)
Total Lymphocyte: 20.3 %
WBC: 10.4 10*3/uL (ref 3.8–10.8)
WBCMIX: 728 {cells}/uL (ref 200–950)

## 2017-07-12 LAB — COMPLETE METABOLIC PANEL WITH GFR
AG Ratio: 1.5 (calc) (ref 1.0–2.5)
ALBUMIN MSPROF: 3.8 g/dL (ref 3.6–5.1)
ALT: 32 U/L (ref 9–46)
AST: 28 U/L (ref 10–40)
Alkaline phosphatase (APISO): 108 U/L (ref 40–115)
BUN: 12 mg/dL (ref 7–25)
CALCIUM: 8.6 mg/dL (ref 8.6–10.3)
CO2: 21 mmol/L (ref 20–32)
CREATININE: 0.99 mg/dL (ref 0.60–1.35)
Chloride: 104 mmol/L (ref 98–110)
GFR, EST AFRICAN AMERICAN: 115 mL/min/{1.73_m2} (ref >=60)
GFR, EST NON AFRICAN AMERICAN: 99 mL/min/{1.73_m2} (ref >=60)
GLOBULIN: 2.6 g/dL (ref 1.9–3.7)
Glucose, Bld: 206 mg/dL — ABNORMAL HIGH (ref 65–99)
Potassium: 4.1 mmol/L (ref 3.5–5.3)
SODIUM: 137 mmol/L (ref 135–146)
Total Bilirubin: 0.4 mg/dL (ref 0.2–1.2)
Total Protein: 6.4 g/dL (ref 6.1–8.1)

## 2017-07-12 LAB — HEMOGLOBIN A1C
HEMOGLOBIN A1C: 7.5 %{Hb} — AB (ref <5.7)
Mean Plasma Glucose: 169 (calc)
eAG (mmol/L): 9.3 (calc)

## 2017-07-12 LAB — THYROID PANEL WITH TSH
Free Thyroxine Index: 2.4 (ref 1.4–3.8)
T3 Uptake: 29 % (ref 22–35)
T4, Total: 8.2 ug/dL (ref 4.9–10.5)
TSH: 0.47 mIU/L (ref 0.40–4.50)

## 2017-07-14 ENCOUNTER — Telehealth: Payer: Self-pay | Admitting: Family Medicine

## 2017-07-14 DIAGNOSIS — Z22322 Carrier or suspected carrier of Methicillin resistant Staphylococcus aureus: Secondary | ICD-10-CM

## 2017-07-14 LAB — WOUND CULTURE
MICRO NUMBER: 81085551
SPECIMEN QUALITY: ADEQUATE

## 2017-07-14 MED ORDER — CLINDAMYCIN HCL 300 MG PO CAPS: 300.0000 mg | ORAL_CAPSULE | Freq: Three times a day (TID) | ORAL | 0 refills | Status: AC

## 2017-07-14 NOTE — Progress Notes (Signed)
Patient notified

## 2017-07-14 NOTE — Telephone Encounter (Signed)
Please contact patient to advise I receive notification from the lab his current skin infection is resistant to the antibiotic prescribed. He has an active MRSA infection and I am sending over a prescription for Clindamycin. The Bactrim he is currently taking will not clear his infection. He should complete the 14 day course of Clindamycin and keep scheduled follow-up.  Godfrey Pick. Tiburcio Pea, MSN, FNP-C [facility]  [address] [place], [place] [address] [phone]

## 2017-07-14 NOTE — Telephone Encounter (Signed)
Patient notified and will pick up new medication.  

## 2017-07-17 DIAGNOSIS — E119 Type 2 diabetes mellitus without complications: Secondary | ICD-10-CM | POA: Insufficient documentation

## 2017-08-03 ENCOUNTER — Other Ambulatory Visit: Payer: Self-pay | Admitting: Family Medicine

## 2017-08-22 ENCOUNTER — Ambulatory Visit: Payer: Self-pay | Admitting: Family Medicine

## 2018-05-27 ENCOUNTER — Encounter (HOSPITAL_COMMUNITY): Payer: Self-pay | Admitting: Emergency Medicine

## 2018-05-27 ENCOUNTER — Ambulatory Visit (HOSPITAL_COMMUNITY)
Admission: EM | Admit: 2018-05-27 | Discharge: 2018-05-27 | Disposition: A | Discharge status: Home / Self Care | Payer: Self-pay | Attending: Family Medicine | Admitting: Family Medicine

## 2018-05-27 ENCOUNTER — Ambulatory Visit (INDEPENDENT_AMBULATORY_CARE_PROVIDER_SITE_OTHER): Payer: Self-pay

## 2018-05-27 DIAGNOSIS — R0789 Other chest pain: Secondary | ICD-10-CM

## 2018-05-27 DIAGNOSIS — M25512 Pain in left shoulder: Secondary | ICD-10-CM

## 2018-05-27 MED ORDER — TRAMADOL HCL 50 MG PO TABS: 50.0000 mg | ORAL_TABLET | Freq: Four times a day (QID) | ORAL | 0 refills | Status: DC | PRN
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
## 2018-11-24 ENCOUNTER — Ambulatory Visit (HOSPITAL_COMMUNITY)
Admission: EM | Admit: 2018-11-24 | Discharge: 2018-11-24 | Disposition: A | Discharge status: Home / Self Care | Payer: 59 | Attending: Family Medicine | Admitting: Family Medicine

## 2018-11-24 ENCOUNTER — Encounter (HOSPITAL_COMMUNITY): Payer: Self-pay | Admitting: Family Medicine

## 2018-11-24 DIAGNOSIS — K047 Periapical abscess without sinus: Secondary | ICD-10-CM | POA: Diagnosis not present

## 2018-11-24 MED ORDER — DOXYCYCLINE HYCLATE 100 MG PO TABS: 100.0000 mg | ORAL_TABLET | Freq: Two times a day (BID) | ORAL | 0 refills | Status: DC

## 2018-11-24 MED ORDER — CEFTRIAXONE SODIUM 1 G IJ SOLR
INTRAMUSCULAR | Status: AC
  Filled 2018-11-24: qty 10

## 2018-11-24 MED ORDER — CEFTRIAXONE SODIUM 1 G IJ SOLR
1.0000 g | Freq: Once | INTRAMUSCULAR | Status: AC
  Administered 2018-11-24: 1 g via INTRAMUSCULAR

## 2018-11-24 MED ORDER — AZITHROMYCIN 250 MG PO TABS: 1000.0000 mg | ORAL_TABLET | Freq: Once | ORAL | Status: DC

## 2018-11-24 MED ORDER — CHLORHEXIDINE GLUCONATE 0.12 % MT SOLN: 15.0000 mL | Freq: Two times a day (BID) | OROMUCOSAL | 0 refills | Status: DC

## 2018-11-24 NOTE — ED Triage Notes (Signed)
Provider triage  

## 2018-11-24 NOTE — Discharge Instructions (Addendum)
Rinse mouth with antiseptic mouth rinse twice daily  Make an appointment with dentist  Return if swelling increases or does not improve by Monday

## 2018-11-24 NOTE — ED Provider Notes (Signed)
[facility]    CSN: [id number] Arrival date & time: 11/24/18  1132     History   Chief Complaint Chief Complaint  Patient presents with  . Abscess    HPI Chris Gray is a 38 y.o. male.   Is a 38 year old man who comes in complaining about an abscess. This began 4 days  Ago.  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  He has a history of MRSA.  He has type 2 diabetes.     History reviewed. No pertinent past medical history.  Patient Active Problem List   Diagnosis Date Noted  . T2DM (type 2 diabetes mellitus) (HCC) 07/17/2017  . MRSA (methicillin resistant staph aureus) culture positive 07/14/2017    Past Surgical History:  Procedure Laterality Date  . ANKLE SURGERY Right    PT reports pins and screws are present       Home Medications    Prior to Admission medications   Medication Sig Start Date End Date Taking? Authorizing Provider  chlorhexidine (PERIDEX) 0.12 % solution Use as directed 15 mLs in the mouth or throat 2 (two) times daily. 11/24/18   Elvina Sidle, MD  doxycycline (VIBRA-TABS) 100 MG tablet Take 1 tablet (100 mg total) by mouth 2 (two) times daily. 11/24/18   Elvina Sidle, MD    Family History Family History  Family history unknown: Yes    Social History Social History   Tobacco Use  . Smoking status: Current Every Day Smoker  . Smokeless tobacco: Never Used  Substance Use Topics  . Alcohol use: Yes    Comment: QOD  . Drug use: No     Allergies   Patient has no known allergies.   Review of Systems Review of Systems   Physical Exam Triage Vital Signs ED Triage Vitals  Enc Vitals Group     BP      Pulse      Resp      Temp      Temp src      SpO2      Weight      Height      Head Circumference      Peak Flow      Pain Score      Pain Loc      Pain Edu?      Excl. in [place]?    No data found.  Updated Vital Signs BP (!) 154/99 (BP Location: Right Arm)   Pulse 94   Temp 97.7 F (36.5  C) (Oral)   Resp 16   SpO2 96%    Physical Exam Vitals signs and nursing note reviewed.  Constitutional:      Appearance: Normal appearance.  HENT:     Mouth/Throat:     Mouth: Mucous membranes are moist.     Comments: Broken tooth #19 Pulmonary:     Effort: Pulmonary effort is normal.  Musculoskeletal: Normal range of motion.  Skin:    General: Skin is warm and dry.     Findings: Erythema present.     Comments: 3 cm firm subcutaneous mass adjacent to tooth #19  Neurological:     General: No focal deficit present.     Mental Status: He is alert.  Psychiatric:        Mood and Affect: Mood normal.      UC Treatments / Results  Labs (all labs ordered are listed, but only abnormal results are displayed) Labs Reviewed - No  data to display  EKG None  Radiology No results found.  Procedures Procedures (including critical care time)  Medications Ordered in UC Medications  cefTRIAXone (ROCEPHIN) injection 1 g (has no administration in time range)    Initial Impression / Assessment and Plan / UC Course  I have reviewed the triage vital signs and the nursing notes.  Pertinent labs & imaging results that were available during my care of the patient were reviewed by me and considered in my medical decision making (see chart for details).    Final Clinical Impressions(s) / UC Diagnoses   Final diagnoses:  Dental abscess     Discharge Instructions     Rinse mouth with antiseptic mouth rinse twice daily  Make an appointment with dentist  Return if swelling increases or does not improve by Monday    ED Prescriptions    Medication Sig Dispense Auth. Provider   doxycycline (VIBRA-TABS) 100 MG tablet Take 1 tablet (100 mg total) by mouth 2 (two) times daily. 20 tablet Elvina Sidle, MD   chlorhexidine (PERIDEX) 0.12 % solution Use as directed 15 mLs in the mouth or throat 2 (two) times daily. 120 mL Elvina Sidle, MD     Controlled Substance  Prescriptions [place] Controlled Substance Registry consulted? Not Applicable   Elvina Sidle, MD 11/24/18 1200

## 2019-09-18 ENCOUNTER — Other Ambulatory Visit: Payer: Self-pay

## 2019-09-18 DIAGNOSIS — Z20822 Contact with and (suspected) exposure to covid-19: Secondary | ICD-10-CM

## 2019-09-19 LAB — NOVEL CORONAVIRUS, NAA: SARS-CoV-2, NAA: NOT DETECTED

## 2019-10-15 ENCOUNTER — Ambulatory Visit: Payer: 59 | Attending: Internal Medicine

## 2019-10-15 DIAGNOSIS — Z20822 Contact with and (suspected) exposure to covid-19: Secondary | ICD-10-CM

## 2019-10-16 ENCOUNTER — Ambulatory Visit: Payer: 59 | Attending: Internal Medicine

## 2019-10-16 LAB — NOVEL CORONAVIRUS, NAA: SARS-CoV-2, NAA: NOT DETECTED

## 2019-11-19 ENCOUNTER — Other Ambulatory Visit: Payer: Self-pay

## 2019-11-19 ENCOUNTER — Ambulatory Visit (HOSPITAL_COMMUNITY)
Admission: EM | Admit: 2019-11-19 | Discharge: 2019-11-19 | Disposition: A | Discharge status: Home / Self Care | Payer: Self-pay | Attending: Family Medicine | Admitting: Family Medicine

## 2019-11-19 ENCOUNTER — Encounter (HOSPITAL_COMMUNITY): Payer: Self-pay

## 2019-11-19 DIAGNOSIS — K0889 Other specified disorders of teeth and supporting structures: Secondary | ICD-10-CM

## 2019-11-19 DIAGNOSIS — K047 Periapical abscess without sinus: Secondary | ICD-10-CM

## 2019-11-19 MED ORDER — HYDROCODONE-ACETAMINOPHEN 7.5-325 MG PO TABS: 1.0000 | ORAL_TABLET | Freq: Four times a day (QID) | ORAL | 0 refills | Status: DC | PRN

## 2019-11-19 MED ORDER — IBUPROFEN 800 MG PO TABS: 800.0000 mg | ORAL_TABLET | Freq: Three times a day (TID) | ORAL | 0 refills | Status: DC

## 2019-11-19 MED ORDER — PENICILLIN V POTASSIUM 500 MG PO TABS: 500.0000 mg | ORAL_TABLET | Freq: Four times a day (QID) | ORAL | 0 refills | Status: AC

## 2019-11-19 NOTE — ED Triage Notes (Signed)
Pt is here with dental pain that started Friday & states it usually goes away but hasnt, he missed work due to this.

## 2019-11-19 NOTE — Discharge Instructions (Addendum)
Take the penicillin as directed Take the ibuprofen for moderate pain Take the hydrocodone for severe pain Follow up with a dentist

## 2019-11-19 NOTE — ED Provider Notes (Signed)
[facility]    CSN: [id number] Arrival date & time: 11/19/19  1437      History   Chief Complaint Chief Complaint  Patient presents with  . Dental Pain    HPI Chris Gray is a 39 y.o. male.   HPI  Patient has known dental caries.  He has a dental fracture on the left molars.  Tooth is broken off at the gumline.  He is started having increased dental pain for the last several days.  He does have pain that comes and goes, but this pain is more severe. He requests a referral to a dentist. He is not allergic to any medicines or antibiotics.  History reviewed. No pertinent past medical history.  Patient Active Problem List   Diagnosis Date Noted  . T2DM (type 2 diabetes mellitus) ([place]) 07/17/2017  . MRSA (methicillin resistant staph aureus) culture positive 07/14/2017    Past Surgical History:  Procedure Laterality Date  . ANKLE SURGERY Right    PT reports pins and screws are present       Home Medications    Prior to Admission medications   Medication Sig Start Date End Date Taking? Authorizing Provider  chlorhexidine (PERIDEX) 0.12 % solution Use as directed 15 mLs in the mouth or throat 2 (two) times daily. 11/24/18   Robyn Haber, MD  HYDROcodone-acetaminophen (NORCO) 7.5-325 MG tablet Take 1 tablet by mouth every 6 (six) hours as needed for moderate pain. 11/19/19   Raylene Everts, MD  ibuprofen (ADVIL) 800 MG tablet Take 1 tablet (800 mg total) by mouth 3 (three) times daily. 11/19/19   Raylene Everts, MD  penicillin v potassium (VEETID) 500 MG tablet Take 1 tablet (500 mg total) by mouth 4 (four) times daily for 10 days. 11/19/19 11/29/19  Raylene Everts, MD    Family History Family History  Family history unknown: Yes    Social History Social History   Tobacco Use  . Smoking status: Current Every Day Smoker    Types: Cigarettes  . Smokeless tobacco: Never Used  Substance Use Topics  . Alcohol use: Yes    Comment: QOD  . Drug  use: Yes    Types: Marijuana     Allergies   Patient has no known allergies.   Review of Systems Review of Systems  HENT: Positive for dental problem.      Physical Exam Triage Vital Signs ED Triage Vitals  Enc Vitals Group     BP 11/19/19 1557 (!) 152/88     Pulse Rate 11/19/19 1557 (!) 106     Resp 11/19/19 1557 19     Temp 11/19/19 1557 98.4 F (36.9 C)     Temp Source 11/19/19 1557 Oral     SpO2 11/19/19 1557 96 %     Weight 11/19/19 1554 262 lb 3.2 oz (118.9 kg)     Height --      Head Circumference --      Peak Flow --      Pain Score 11/19/19 1553 7     Pain Loc --      Pain Edu? --      Excl. in [place]? --    No data found.  Updated Vital Signs BP (!) 152/88 (BP Location: Left Arm)   Pulse (!) 106   Temp 98.4 F (36.9 C) (Oral)   Resp 19   Wt 118.9 kg   SpO2 96%   BMI 36.57 kg/m  Physical Exam Constitutional:      General: He is not in acute distress.    Appearance: He is well-developed and normal weight.     Comments: Overweight.  Appears uncomfortable  HENT:     Head: Normocephalic and atraumatic.     Nose: Nose normal. No congestion.     Mouth/Throat:     Mouth: Mucous membranes are moist.     Comments: Left lower molar is broken off at the gumline.  There is erythema of the surrounding gums. Eyes:     Conjunctiva/sclera: Conjunctivae normal.     Pupils: Pupils are equal, round, and reactive to light.  Cardiovascular:     Rate and Rhythm: Normal rate.  Pulmonary:     Effort: Pulmonary effort is normal. No respiratory distress.  Musculoskeletal:        General: Normal range of motion.     Cervical back: Normal range of motion.  Lymphadenopathy:     Cervical: No cervical adenopathy.  Skin:    General: Skin is warm and dry.  Neurological:     Mental Status: He is alert.  Psychiatric:        Mood and Affect: Mood normal.        Behavior: Behavior normal.      UC Treatments / Results  Labs (all labs ordered are listed, but only  abnormal results are displayed) Labs Reviewed - No data to display  EKG   Radiology No results found.  Procedures Procedures (including critical care time)  Medications Ordered in UC Medications - No data to display  Initial Impression / Assessment and Plan / UC Course  I have reviewed the triage vital signs and the nursing notes.  Pertinent labs & imaging results that were available during my care of the patient were reviewed by me and considered in my medical decision making (see chart for details).      Final Clinical Impressions(s) / UC Diagnoses   Final diagnoses:  Pain, dental  Dental infection     Discharge Instructions     Take the penicillin as directed Take the ibuprofen for moderate pain Take the hydrocodone for severe pain Follow up with a dentist   ED Prescriptions    Medication Sig Dispense Auth. Provider   penicillin v potassium (VEETID) 500 MG tablet Take 1 tablet (500 mg total) by mouth 4 (four) times daily for 10 days. 40 tablet Eustace Moore, MD   ibuprofen (ADVIL) 800 MG tablet Take 1 tablet (800 mg total) by mouth 3 (three) times daily. 21 tablet Eustace Moore, MD   HYDROcodone-acetaminophen [facility]) 7.5-325 MG tablet Take 1 tablet by mouth every 6 (six) hours as needed for moderate pain. 15 tablet Eustace Moore, MD     I have reviewed the PDMP during this encounter.   Eustace Moore, MD 11/19/19 2120

## 2020-01-18 ENCOUNTER — Other Ambulatory Visit: Payer: Self-pay

## 2020-01-18 ENCOUNTER — Ambulatory Visit (HOSPITAL_COMMUNITY)
Admission: EM | Admit: 2020-01-18 | Discharge: 2020-01-18 | Disposition: A | Discharge status: Home / Self Care | Payer: 59

## 2020-01-18 ENCOUNTER — Encounter (HOSPITAL_COMMUNITY): Payer: Self-pay

## 2020-01-18 DIAGNOSIS — G8929 Other chronic pain: Secondary | ICD-10-CM

## 2020-01-18 DIAGNOSIS — M25512 Pain in left shoulder: Secondary | ICD-10-CM

## 2020-01-18 DIAGNOSIS — K047 Periapical abscess without sinus: Secondary | ICD-10-CM

## 2020-01-18 DIAGNOSIS — K0889 Other specified disorders of teeth and supporting structures: Secondary | ICD-10-CM

## 2020-01-18 MED ORDER — IBUPROFEN 800 MG PO TABS: 800.0000 mg | ORAL_TABLET | Freq: Three times a day (TID) | ORAL | 0 refills | Status: DC

## 2020-01-18 MED ORDER — CHLORHEXIDINE GLUCONATE 0.12 % MT SOLN: 15.0000 mL | Freq: Two times a day (BID) | OROMUCOSAL | 0 refills | Status: DC

## 2020-01-18 MED ORDER — AMOXICILLIN-POT CLAVULANATE 875-125 MG PO TABS: 1.0000 | ORAL_TABLET | Freq: Two times a day (BID) | ORAL | 0 refills | Status: AC
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
## 2020-01-18 NOTE — Discharge Instructions (Signed)
Take the augmentin 2 times a day Take the ibuprofen 3 times a day  Call one of the attached dental resources. You may also try, urgent tooth
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
## 2020-05-21 ENCOUNTER — Encounter (HOSPITAL_COMMUNITY): Payer: Self-pay

## 2020-05-21 ENCOUNTER — Other Ambulatory Visit: Payer: Self-pay

## 2020-05-21 ENCOUNTER — Ambulatory Visit (HOSPITAL_COMMUNITY)
Admission: EM | Admit: 2020-05-21 | Discharge: 2020-05-21 | Disposition: A | Discharge status: Home / Self Care | Payer: 59 | Attending: Urgent Care | Admitting: Urgent Care

## 2020-05-21 DIAGNOSIS — L02214 Cutaneous abscess of groin: Secondary | ICD-10-CM | POA: Diagnosis not present

## 2020-05-21 DIAGNOSIS — Z8639 Personal history of other endocrine, nutritional and metabolic disease: Secondary | ICD-10-CM

## 2020-05-21 DIAGNOSIS — Z8614 Personal history of Methicillin resistant Staphylococcus aureus infection: Secondary | ICD-10-CM

## 2020-05-21 DIAGNOSIS — R1031 Right lower quadrant pain: Secondary | ICD-10-CM

## 2020-05-21 DIAGNOSIS — R739 Hyperglycemia, unspecified: Secondary | ICD-10-CM

## 2020-05-21 LAB — CBG MONITORING, ED: Glucose-Capillary: 211 mg/dL — ABNORMAL HIGH (ref 70–99)

## 2020-05-21 MED ORDER — DOXYCYCLINE HYCLATE 100 MG PO CAPS: 100.0000 mg | ORAL_CAPSULE | Freq: Two times a day (BID) | ORAL | 0 refills | Status: DC

## 2020-05-21 MED ORDER — METFORMIN HCL 500 MG PO TABS: 500.0000 mg | ORAL_TABLET | Freq: Two times a day (BID) | ORAL | 0 refills | Status: DC

## 2020-05-21 NOTE — ED Notes (Signed)
Attempted to call for patient x 1 with no answer

## 2020-05-21 NOTE — ED Provider Notes (Signed)
[facility]   MRN: [id number] DOB: 24-Mar-1981  Subjective:   Chris Gray is a 39 y.o. male presenting for open abscess of the right groin site.  Patient states that it is draining a lot.  Chart shows that he has a history of diabetes, MRSA.  Patient states that he has never had these diagnoses.  I confirmed his name and date of birth, patient also confirmed his information we do have the correct patient.  Has not tried any medications for relief.  Denies fever, nausea, vomiting, dysuria, penile pain, testicular pain.  Patient does not have a PCP right now.  Denies taking chronic medications.    No Known Allergies  History reviewed. No pertinent past medical history.   Past Surgical History:  Procedure Laterality Date  . ANKLE SURGERY Right    PT reports pins and screws are present    Family History  Family history unknown: Yes    Social History   Tobacco Use  . Smoking status: Current Every Day Smoker    Types: Cigarettes  . Smokeless tobacco: Never Used  Vaping Use  . Vaping Use: Never used  Substance Use Topics  . Alcohol use: Yes    Comment: QOD  . Drug use: Yes    Types: Marijuana    ROS   Objective:   Vitals: BP (!) 141/90 (BP Location: Left Arm)   Pulse 92   Temp 98.7 F (37.1 C) (Oral)   Resp 18   SpO2 99%   Physical Exam Constitutional:      General: He is not in acute distress.    Appearance: Normal appearance. He is well-developed. He is obese. He is not ill-appearing, toxic-appearing or diaphoretic.  HENT:     Head: Normocephalic and atraumatic.     Right Ear: External ear normal.     Left Ear: External ear normal.     Nose: Nose normal.     Mouth/Throat:     Pharynx: Oropharynx is clear.  Eyes:     General: No scleral icterus.       Right eye: No discharge.        Left eye: No discharge.     Extraocular Movements: Extraocular movements intact.     Pupils: Pupils are equal, round, and reactive to light.  Cardiovascular:      Rate and Rhythm: Normal rate.  Pulmonary:     Effort: Pulmonary effort is normal.  Genitourinary:   Musculoskeletal:     Cervical back: Normal range of motion.  Neurological:     Mental Status: He is alert and oriented to person, place, and time.  Psychiatric:        Mood and Affect: Mood normal.        Behavior: Behavior normal.        Thought Content: Thought content normal.        Judgment: Judgment normal.     Results for orders placed or performed during the hospital encounter of 05/21/20 (from the past 24 hour(s))  POC CBG monitoring     Status: Abnormal   Collection Time: 05/21/20 11:12 AM  Result Value Ref Range   Glucose-Capillary 211 (H) 70 - 99 mg/dL    Assessment and Plan :   PDMP not reviewed this encounter.  1. Groin pain, right   2. Abscess of groin, right   3. History of MRSA infection   4. History of diabetes mellitus     Patient is to start doxycycline, counseled on  wound care.  Recommend starting Metformin for uncontrolled diabetes.  Establish care with a new PCP through [facility] internal medicine.  Emphasized need for dietary modifications for management of his uncontrolled diabetes. Counseled patient on potential for adverse effects with medications prescribed/recommended today, ER and return-to-clinic precautions discussed, patient verbalized understanding.    Wallis Bamberg, PA-C 05/21/20 1343

## 2020-05-21 NOTE — Discharge Instructions (Addendum)
Please change your dressing 3-5 times daily. Do not apply any ointments or creams. Each time you change your dressing, make sure that you are pressing on the wound to get pus to come out.  Try your best to have a family member help you clean gently around the perimeter of the wound with gentle soap and warm water. Pat your wound dry and let it air out if possible to make sure it is dry before reapplying another dressing.    For diabetes or elevated blood sugar, please make sure you are avoiding starchy, carbohydrate foods like pasta, breads, pastry, rice, potatoes, desserts. These foods can elevated your blood sugar. Also, avoid sodas, sweet teas, sugary beverages, fruit juices.  Drinking plain water will be much more helpful, try 64 ounces of water daily.  It is okay to flavor your water naturally by cutting cucumber, lemon, mint or lime, placing it in a picture with water and drinking it over a period of 2 to 3 days as long as it remains refrigerated.    For elevated blood pressure, make sure you are monitoring salt in your diet.  Do not eat restaurant foods and limit processed foods at home, prepare/cook your own foods at home.  Processed foods include things like frozen meals preseasoned meats and dinners, deli meats, canned foods as they are high in sodium/salt.  Make sure your pain attention to sodium labels on foods you by at the grocery store.  For seasoning you can use a brand called Mrs. Dash which includes a lot of salt free seasonings.  Salads - kale, spinach, cabbage, spring mix; use seeds like pumpkin seeds or sunflower seeds, almonds, walnuts or pecans; you can also use 1-2 hard boiled eggs in your salads Fruits - avocadoes, berries (blueberries, raspberries, blackberries), apples, oranges, pomegranate, pear; avoid eating bananas, grapes regularly Vegetables - aspargus, cauliflower, broccoli, green beans, brussel spouts, bell peppers; stay away from starchy vegetables like potatoes, carrots,  peas  Regarding meat it is better to eat lean meats and limit your red meat including pork to once a week.  Wild caught fish, chicken breast are good options as they tend to be leaner sources of good protein.   DO NOT EAT ANY FOODS ON THIS LIST THAT YOU ARE ALLERGIC TO.

## 2020-05-21 NOTE — ED Triage Notes (Addendum)
Patient is here today with complaints of mulitple abscesses on upper/lower extremities. Patient states the abscess on right side groin area burst this morning. Patient states he does not have a PCP.

## 2020-05-30 ENCOUNTER — Ambulatory Visit: Payer: 59 | Admitting: Nurse Practitioner

## 2020-06-11 ENCOUNTER — Ambulatory Visit (INDEPENDENT_AMBULATORY_CARE_PROVIDER_SITE_OTHER): Payer: 59 | Admitting: Nurse Practitioner

## 2020-06-11 ENCOUNTER — Other Ambulatory Visit: Payer: Self-pay

## 2020-06-11 ENCOUNTER — Encounter: Payer: Self-pay | Admitting: Nurse Practitioner

## 2020-06-11 VITALS — BP 158/91 | HR 80 | Temp 97.0°F | Ht 71.0 in | Wt 243.0 lb

## 2020-06-11 DIAGNOSIS — E669 Obesity, unspecified: Secondary | ICD-10-CM

## 2020-06-11 DIAGNOSIS — E119 Type 2 diabetes mellitus without complications: Secondary | ICD-10-CM | POA: Diagnosis not present

## 2020-06-11 DIAGNOSIS — Z1159 Encounter for screening for other viral diseases: Secondary | ICD-10-CM

## 2020-06-11 DIAGNOSIS — R030 Elevated blood-pressure reading, without diagnosis of hypertension: Secondary | ICD-10-CM

## 2020-06-11 LAB — POCT URINALYSIS DIPSTICK (MANUAL)
Leukocytes, UA: NEGATIVE
Nitrite, UA: NEGATIVE
Poct Bilirubin: NEGATIVE
Poct Glucose: 1000 mg/dL — AB
Poct Ketones: NEGATIVE
Poct Protein: NEGATIVE mg/dL
Poct Urobilinogen: NORMAL mg/dL
Spec Grav, UA: 1.015 (ref 1.010–1.025)
pH, UA: 5 (ref 5.0–8.0)

## 2020-06-11 LAB — POCT GLYCOSYLATED HEMOGLOBIN (HGB A1C)
HbA1c POC (<> result, manual entry): 14.5 % (ref 4.0–5.6)
HbA1c, POC (controlled diabetic range): 14.5 % — AB (ref 0.0–7.0)
HbA1c, POC (prediabetic range): 14.5 % — AB (ref 5.7–6.4)
Hemoglobin A1C: 14.5 % — AB (ref 4.0–5.6)

## 2020-06-11 NOTE — Patient Instructions (Signed)
Type 2 Diabetes Mellitus, Diagnosis, Adult Type 2 diabetes (type 2 diabetes mellitus) is a long-term (chronic) disease. It may be caused by one or both of these problems:  Your pancreas does not make enough of a hormone called insulin.  Your body does not react in a normal way to insulin that it makes. Insulin lets sugars (glucose) go into cells in your body. This gives you energy. If you have type 2 diabetes, sugars cannot get into cells. This causes high blood sugar (hyperglycemia). Your doctor will set treatment goals for you. Generally, you should have these blood sugar levels:  Before meals (preprandial): 80-130 mg/dL (3.7-9.0 mmol/L).  After meals (postprandial): below 180 mg/dL (10 mmol/L).  A1c (hemoglobin A1c) level: less than 7%. Follow these instructions at home: Questions to ask your doctor  You may want to ask these questions: ? Do I need to meet with a diabetes educator? ? Where can I find a support group for people with diabetes? ? What equipment will I need to care for myself at home? ? What diabetes medicines do I need? When should I take them? ? How often do I need to check my blood sugar? ? What number can I call if I have questions? ? When is my next doctor's visit? General instructions  Take over-the-counter and prescription medicines only as told by your doctor.  Keep all follow-up visits as told by your doctor. This is important. Contact a doctor if:  Your blood sugar is at or above 240 mg/dL (24.0 mmol/L) for 2 days in a row.  You have been sick for 2 days or more, and you are not getting better.  You have had a fever for 2 days or more, and you are not getting better.  You have any of these problems for more than 6 hours: ? You cannot eat or drink. ? You feel sick to your stomach (nauseous). ? You throw up (vomit). ? You have watery poop (diarrhea). Get help right away if:  Your blood sugar is lower than 54 mg/dL (3 mmol/L).  You get  confused.  You have trouble: ? Thinking clearly. ? Breathing.  You have moderate or large ketone levels in your pee (urine). Summary  Type 2 diabetes is a long-term (chronic) disease. Your pancreas may not make enough of a hormone called insulin, or your body may not react normally to insulin that it makes.  Take over-the-counter and prescription medicines only as told by your doctor.  Keep all follow-up visits as told by your doctor. This is important. This information is not intended to replace advice given to you by your health care provider. Make sure you discuss any questions you have with your health care provider. Document Revised: 11/25/2017 Document Reviewed: 10/31/2015 Elsevier Patient Education  2020 [employer]  Living With Diabetes Diabetes (type 1 diabetes mellitus or type 2 diabetes mellitus) is a condition in which the body does not have enough of a hormone called insulin, or the body does not respond properly to insulin. Normally, insulin allows sugars (glucose) to enter cells in the body. The cells use glucose for energy. With diabetes, extra glucose builds up in the blood instead of going into cells, which results in high blood glucose (hyperglycemia). How to manage lifestyle changes Managing diabetes includes medical treatments as well as lifestyle changes. If diabetes is not managed well, serious physical and emotional complications can occur. Taking good care of yourself means that you are responsible for:  Monitoring glucose  regularly.  Eating a healthy diet.  Exercising regularly.  Meeting with health care providers.  Taking medicines as directed. Some people may feel a lot of stress about managing their diabetes. This is known as emotional distress, and it is very common. Living with diabetes can place you at risk for emotional distress, depression, or anxiety. These disorders can be confusing and can make diabetes management more difficult. How to  recognize stress Emotional distress Symptoms of emotional distress include:  Anger about having a diagnosis of diabetes.  Fear or frustration about your diagnosis and the changes you need to make to manage the condition.  Being overly worried about the care that you need or the cost of the care that you need.  Feeling like you caused your condition by doing something wrong.  Fear of unpredictable situations, like low or high blood glucose.  Feeling judged by your health care providers.  Feeling very alone with the disease.  Getting too tired or worn out with the demands of daily care. Depression Having diabetes means that you are at a higher risk for depression. Having depression also means that you are at a higher risk for diabetes. Your health care provider may test (screen) you for symptoms of depression. It is important to recognize depression symptoms and to start treatment for depression soon after it is diagnosed. The following are some symptoms of depression:  Loss of interest in things that you used to enjoy.  Trouble sleeping, or often waking up early and not being able to get back to sleep.  A change in appetite.  Feeling tired most of the day.  Feeling nervous and anxious.  Feeling guilty and worrying that you are a burden to others.  Feeling depressed more often than you do not feel that way.  Thoughts of hurting yourself or feeling that you want to die. If you have any of these symptoms for 2 weeks or longer, reach out to a health care provider. Follow these instructions at home: Managing emotional distress The following are some ways to manage emotional distress:  Talk with your health care provider or certified diabetes educator. Consider working with a counselor or therapist.  Learn as much as you can about diabetes and its treatment. Meet with a certified diabetes educator or take a class to learn how to manage your condition.  Keep a journal of your  thoughts and concerns.  Accept that some things are out of your control.  Talk with other people who have diabetes. It can help to talk with others about the emotional distress that you feel.  Find ways to manage stress that work for you. These may include art or music therapy, exercise, meditation, and hobbies.  Seek support from spiritual leaders, family, and friends. General instructions  Follow your diabetes management plan.  Keep all follow-up visits as told by your health care provider. This is important. Where to find support   Ask your health care provider to recommend a therapist who understands both depression and diabetes.  Search for information and support from the American Diabetes Association: www.diabetes.org  Find a certified diabetes educator and make an appointment through American Association of Diabetes Educators: www.diabeteseducator.org Get help right away if:  You have thoughts about hurting yourself or others. If you ever feel like you may hurt yourself or others, or have thoughts about taking your own life, get help right away. You can go to your nearest emergency department or call:  Your local emergency services (  911 in the [place]  A suicide crisis helpline, such as the National Suicide Prevention Lifeline at 724-448-0564. This is open 24 hours a day. Summary  Diabetes (type 1 diabetes mellitus or type 2 diabetes mellitus) is a condition in which the body does not have enough of a hormone called insulin, or the body does not respond properly to insulin.  Living with diabetes puts you at risk for medical issues, and it also puts you at risk for emotional issues such as emotional distress, depression, and anxiety.  Recognizing the symptoms of emotional distress and depression may help you avoid problems with your diabetes control. It is important to start treatment for emotional distress and depression soon after they are diagnosed.  Having diabetes  means that you are at a higher risk for depression. Ask your health care provider to recommend a therapist who understands both depression and diabetes.  If you experience symptoms of emotional distress or depression, it is important to discuss this with your health care provider, certified diabetes educator, or therapist. This information is not intended to replace advice given to you by your health care provider. Make sure you discuss any questions you have with your health care provider. Document Revised: 10/09/2018 Document Reviewed: 02/10/2017 Elsevier Patient Education  2020 [employer].

## 2020-06-11 NOTE — Progress Notes (Signed)
[place],   [address] Phone:  [phone]   Fax:  [phone]    New Patient Office Visit  Subjective:  Patient ID: MALIKAI GUT, male    DOB: 08/20/1981  Age: 39 y.o. MRN: [id number]  CC:  Chief Complaint  Patient presents with  . Establish Care    went to urgent care, given medication Metformin said Blood sugar was high Patient hasn't taken yet.    HPI ORLANDER NORWOOD presents to establish care. He  has no past medical history on file.  He was seen in Urgent care for "boil". He was treated with Doxycyline . He has a previous diagnosed with DM in 2018 with A1c of  7.5%. He has been lost to follow up over the last 3 yr. He was started on Metformin 500 mg twice daily at urgent care.  He has refused to take the medication.  So today presents for follow up of diabetes. Current symptoms include: visual disturbances. Symptoms have stabilized. Patient denies foot ulcerations, increased appetite, nausea, paresthesia of the feet, polydipsia, polyuria, vomiting and weight loss. Evaluation to date has included: fasting blood sugar and hemoglobin A1C.  Home sugars: patient does not check sugars. Current treatment: no recent interventions. Last dilated eye exam: unknown. He admits that his brother has diabetes. He father is deceased he denies dm in any other family members.   No past medical history on file.  Past Surgical History:  Procedure Laterality Date  . ANKLE SURGERY Right    PT reports pins and screws are present    Family History  Family history unknown: Yes    Social History   Socioeconomic History  . Marital status: Single    Spouse name: Not on file  . Number of children: Not on file  . Years of education: Not on file  . Highest education level: Not on file  Occupational History  . Not on file  Tobacco Use  . Smoking status: Current Every Day Smoker    Types: Cigarettes  . Smokeless tobacco: Never Used  Vaping  Use  . Vaping Use: Never used  Substance and Sexual Activity  . Alcohol use: Yes    Comment: QOD  . Drug use: Yes    Types: Marijuana  . Sexual activity: Yes    Birth control/protection: Condom  Other Topics Concern  . Not on file  Social History Narrative  . Not on file   Social Determinants of Health   Financial Resource Strain:   . Difficulty of Paying Living Expenses: Not on file  Food Insecurity:   . Worried About Charity fundraiser in the Last Year: Not on file  . Ran Out of Food in the Last Year: Not on file  Transportation Needs:   . Lack of Transportation (Medical): Not on file  . Lack of Transportation (Non-Medical): Not on file  Physical Activity:   . Days of Exercise per Week: Not on file  . Minutes of Exercise per Session: Not on file  Stress:   . Feeling of Stress : Not on file  Social Connections:   . Frequency of Communication with Friends and Family: Not on file  . Frequency of Social Gatherings with Friends and Family: Not on file  . Attends Religious Services: Not on file  . Active Member of Clubs or Organizations: Not on file  . Attends Archivist Meetings: Not on file  . Marital Status:  Not on file  Intimate Partner Violence:   . Fear of Current or Ex-Partner: Not on file  . Emotionally Abused: Not on file  . Physically Abused: Not on file  . Sexually Abused: Not on file    ROS Review of Systems  Objective:   Today's Vitals: BP (!) 158/91   Pulse 80   Temp (!) 97 F (36.1 C) (Temporal)   Ht _0  (1.803 m)   Wt 243 lb (110.2 kg)   SpO2 100%   BMI 33.89 kg/m   Physical Exam Constitutional:      Appearance: Normal appearance. He is obese. He is not ill-appearing or diaphoretic.  HENT:     Head: Normocephalic and atraumatic.     Nose: Nose normal.     Mouth/Throat:     Mouth: Mucous membranes are moist.  Cardiovascular:     Rate and Rhythm: Normal rate and regular rhythm.     Pulses: Normal pulses.          Dorsalis  pedis pulses are 2+ on the right side and 2+ on the left side.       Posterior tibial pulses are 2+ on the right side and 2+ on the left side.     Heart sounds: Normal heart sounds.  Pulmonary:     Effort: Pulmonary effort is normal.     Breath sounds: Normal breath sounds.  Abdominal:     General: Bowel sounds are normal.     Palpations: Abdomen is soft.  Musculoskeletal:        General: Normal range of motion.     Cervical back: Normal range of motion.     Comments: Left swelling hx of fracture  Feet:     Right foot:     Protective Sensation: 10 sites tested. 10 sites sensed.     Skin integrity: Callus present.     Toenail Condition: Right toenails are normal.     Left foot:     Protective Sensation: 10 sites tested. 10 sites sensed.     Skin integrity: Callus present.     Toenail Condition: Left toenails are normal.  Skin:    General: Skin is warm and dry.     Capillary Refill: Capillary refill takes less than 2 seconds.  Neurological:     General: No focal deficit present.     Mental Status: He is oriented to person, place, and time.  Psychiatric:        Mood and Affect: Mood normal.        Behavior: Behavior normal.        Thought Content: Thought content normal.        Judgment: Judgment normal.     Assessment & Plan:   Problem List Items Addressed This Visit      Endocrine   T2DM (type 2 diabetes mellitus) ([place]) - Primary Encourage compliance with current treatment regimen  Dose adjustment increased Metformin 1000 mg twice daily.  We will add additional medications at 6-week follow-up.  ACE inhibitors for elevated blood pressure.  Low-dose statin.  Treatment regimen will be gradually added to ensure compliance Encourage regular CBG monitoring. Encourage contacting office if excessive hyperglycemia and or hypoglycemia Lifestyle modification with healthy diet (fewer calories, more high fiber foods, whole grains and non-starchy vegetables, lower fat meat and fish,  low-fat diary include healthy oils) regular exercise (physical activity) and weight loss Opthalmology exam discussed  Nutritional consult recommended Regular dental visits encouraged Home BP monitoring also encouraged  goal <130/80     Relevant Orders   POCT Urinalysis Dip Manual (Completed)   Lipid panel   Comp. Metabolic Panel (12)   Microalbumin, urine   POCT HgB A1C (Completed)    Other Visit Diagnoses    Encounter for hepatitis C screening test for low risk patient       Relevant Orders   Hepatitis C antibody   Elevated blood pressure reading    Encouraged home monitoring and recording BP <130/80 Eating a heart-healthy diet with less salt Encouraged regular physical activity  Recommend Weight loss        Outpatient Encounter Medications as of 06/11/2020  Medication Sig  . doxycycline (VIBRAMYCIN) 100 MG capsule Take 1 capsule (100 mg total) by mouth 2 (two) times daily.  . metFORMIN (GLUCOPHAGE) 500 MG tablet Take 1 tablet (500 mg total) by mouth 2 (two) times daily with a meal.  . blood glucose meter kit and supplies KIT Dispense based on patient and insurance preference. Use up to four times daily as directed. (FOR ICD-9 250.00, 250.01).   No facility-administered encounter medications on file as of 06/11/2020.    Follow-up: Return in about 6 weeks (around 07/23/2020).   Vevelyn Francois, NP

## 2020-06-12 ENCOUNTER — Encounter: Payer: Self-pay | Admitting: Nurse Practitioner

## 2020-06-12 LAB — LIPID PANEL
Chol/HDL Ratio: 9.1 ratio — ABNORMAL HIGH (ref 0.0–5.0)
Cholesterol, Total: 375 mg/dL — ABNORMAL HIGH (ref 100–199)
HDL: 41 mg/dL (ref >39)
LDL Chol Calc (NIH): 258 mg/dL — ABNORMAL HIGH (ref 0–99)
Triglycerides: 343 mg/dL — ABNORMAL HIGH (ref 0–149)
VLDL Cholesterol Cal: 76 mg/dL — ABNORMAL HIGH (ref 5–40)

## 2020-06-12 LAB — MICROALBUMIN, URINE: Microalbumin, Urine: 14.4 ug/mL

## 2020-06-12 LAB — COMP. METABOLIC PANEL (12)
AST: 46 IU/L — ABNORMAL HIGH (ref 0–40)
Albumin/Globulin Ratio: 1.9 (ref 1.2–2.2)
Albumin: 4.6 g/dL (ref 4.0–5.0)
Alkaline Phosphatase: 122 IU/L — ABNORMAL HIGH (ref 48–121)
BUN/Creatinine Ratio: 11 (ref 9–20)
BUN: 9 mg/dL (ref 6–20)
Bilirubin Total: <0.2 mg/dL (ref 0.0–1.2)
Calcium: 9.7 mg/dL (ref 8.7–10.2)
Chloride: 98 mmol/L (ref 96–106)
Creatinine, Ser: 0.84 mg/dL (ref 0.76–1.27)
GFR calc Af Amer: 129 mL/min/{1.73_m2} (ref >59)
GFR calc non Af Amer: 112 mL/min/{1.73_m2} (ref >59)
Globulin, Total: 2.4 g/dL (ref 1.5–4.5)
Glucose: 384 mg/dL — ABNORMAL HIGH (ref 65–99)
Potassium: 4.4 mmol/L (ref 3.5–5.2)
Sodium: 133 mmol/L — ABNORMAL LOW (ref 134–144)
Total Protein: 7 g/dL (ref 6.0–8.5)

## 2020-06-12 LAB — HEPATITIS C ANTIBODY: Hep C Virus Ab: <0.1 s/co ratio (ref 0.0–0.9)

## 2020-06-12 MED ORDER — BLOOD GLUCOSE MONITOR KIT: PACK | 0 refills | Status: AC

## 2020-06-23 ENCOUNTER — Other Ambulatory Visit: Payer: Self-pay | Admitting: Nurse Practitioner

## 2020-06-23 ENCOUNTER — Telehealth: Payer: Self-pay

## 2020-06-23 MED ORDER — BLOOD GLUCOSE MONITOR KIT: PACK | 0 refills | Status: DC

## 2020-06-23 NOTE — Telephone Encounter (Signed)
Faxed and confirmed prescription for glucose meter to [facility] on [address]

## 2020-06-23 NOTE — Telephone Encounter (Signed)
Patient needs a glucose monitor machine. He currently does not have a machine.   Please send to [facility] on [address]

## 2020-06-23 NOTE — Progress Notes (Unsigned)
   [facility] [address] [place], [place]  [address] Phone:  [phone]   Fax:  [phone] 

## 2020-07-14 ENCOUNTER — Other Ambulatory Visit: Payer: Self-pay

## 2020-07-14 ENCOUNTER — Ambulatory Visit (INDEPENDENT_AMBULATORY_CARE_PROVIDER_SITE_OTHER): Payer: Self-pay

## 2020-07-14 ENCOUNTER — Ambulatory Visit (HOSPITAL_COMMUNITY)
Admission: EM | Admit: 2020-07-14 | Discharge: 2020-07-14 | Disposition: A | Discharge status: Home / Self Care | Payer: Self-pay | Attending: Urgent Care | Admitting: Urgent Care

## 2020-07-14 ENCOUNTER — Encounter (HOSPITAL_COMMUNITY): Payer: Self-pay

## 2020-07-14 DIAGNOSIS — E1165 Type 2 diabetes mellitus with hyperglycemia: Secondary | ICD-10-CM

## 2020-07-14 DIAGNOSIS — S6992XA Unspecified injury of left wrist, hand and finger(s), initial encounter: Secondary | ICD-10-CM

## 2020-07-14 DIAGNOSIS — M7989 Other specified soft tissue disorders: Secondary | ICD-10-CM

## 2020-07-14 DIAGNOSIS — M79642 Pain in left hand: Secondary | ICD-10-CM

## 2020-07-14 DIAGNOSIS — S62305A Unspecified fracture of fourth metacarpal bone, left hand, initial encounter for closed fracture: Secondary | ICD-10-CM

## 2020-07-14 MED ORDER — IBUPROFEN 800 MG PO TABS
800.0000 mg | ORAL_TABLET | Freq: Once | ORAL | Status: AC
  Administered 2020-07-14: 800 mg via ORAL

## 2020-07-14 MED ORDER — IBUPROFEN 800 MG PO TABS
ORAL_TABLET | ORAL | Status: AC
  Filled 2020-07-14: qty 1

## 2020-07-14 MED ORDER — HYDROCODONE-ACETAMINOPHEN 5-325 MG PO TABS: 2.0000 | ORAL_TABLET | Freq: Four times a day (QID) | ORAL | 0 refills | Status: DC | PRN

## 2020-07-14 MED ORDER — NAPROXEN 500 MG PO TABS: 500.0000 mg | ORAL_TABLET | Freq: Two times a day (BID) | ORAL | 0 refills | Status: DC
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
## 2020-07-14 NOTE — Progress Notes (Signed)
Orthopedic Tech Progress Note Patient Details:  Chris Gray 09-Jan-1981 [id number]  Ortho Devices Type of Ortho Device: Ulna gutter splint, Arm sling Ortho Device/Splint Location: lue Ortho Device/Splint Interventions: Ordered, Application, Adjustment   Post Interventions Patient Tolerated: Well Instructions Provided: Care of device, Adjustment of device   Trinna Post 07/14/2020, 9:39 PM
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
## 2020-07-14 NOTE — Discharge Instructions (Signed)
Please contact Dr. Izora Ribas, a hand surgeon, tomorrow morning to schedule a follow-up appointment for your hand fracture. Please schedule naproxen twice daily with food for your severe pain.  If you still have pain despite taking naproxen regularly, this is breakthrough pain.  You can use hydrocodone, a narcotic pain medicine, once every 4-6 hours for this.  Once your pain is better controlled, switch back to just naproxen.

## 2020-07-23 ENCOUNTER — Ambulatory Visit: Payer: 59 | Admitting: Nurse Practitioner

## 2020-07-25 ENCOUNTER — Encounter: Payer: Self-pay | Admitting: Nurse Practitioner

## 2020-07-25 ENCOUNTER — Ambulatory Visit (INDEPENDENT_AMBULATORY_CARE_PROVIDER_SITE_OTHER): Payer: 59 | Admitting: Nurse Practitioner

## 2020-07-25 ENCOUNTER — Other Ambulatory Visit: Payer: Self-pay

## 2020-07-25 VITALS — BP 157/91 | HR 82 | Temp 97.3°F | Ht 71.0 in | Wt 252.0 lb

## 2020-07-25 DIAGNOSIS — E119 Type 2 diabetes mellitus without complications: Secondary | ICD-10-CM

## 2020-07-25 LAB — GLUCOSE, POCT (MANUAL RESULT ENTRY): POC Glucose: 151 mg/dl — AB (ref 70–99)

## 2020-07-25 MED ORDER — TRUE METRIX METER W/DEVICE KIT: 1.0000 | PACK | Freq: Two times a day (BID) | 0 refills | Status: AC

## 2020-07-25 MED ORDER — ROSUVASTATIN CALCIUM 20 MG PO TABS: 20.0000 mg | ORAL_TABLET | Freq: Every day | ORAL | 3 refills | Status: DC

## 2020-07-25 MED ORDER — RELION TRUE METRIX TEST STRIPS VI STRP: ORAL_STRIP | 12 refills | Status: AC

## 2020-07-25 MED ORDER — METFORMIN HCL 500 MG PO TABS: 1000.0000 mg | ORAL_TABLET | Freq: Two times a day (BID) | ORAL | 0 refills | Status: DC

## 2020-07-25 NOTE — Progress Notes (Signed)
[place], [place]  [address] Phone:  [phone]   Fax:  [phone]   Established Patient Office Visit  Subjective:  Patient ID: Chris Gray, male    DOB: Feb 27, 1981  Age: 39 y.o. MRN: [id number]  CC:  Chief Complaint  Patient presents with  . Follow-up    HPI Chris Gray presents for follow up. He  has no past medical history on file.   Diabetes Mellitus Patient presents for follow up of diabetes. Current symptoms include: hyperglycemia and visual disturbances. Symptoms have progressed to a point and plateaued. Patient denies foot ulcerations, hypoglycemia , increased appetite, nausea, paresthesia of the feet, polydipsia, polyuria, vomiting and weight loss. Evaluation to date has included: fasting blood sugar, fasting lipid panel, hemoglobin A1C and microalbuminuria.  Home sugars: patient does not check sugars. Current treatment: Continued metformin which has been somewhat effective. Last dilated eye exam: TBS.  He has made some lifestyle modifications.  He is monitoring his diet closely along with his family.   No past medical history on file.  Past Surgical History:  Procedure Laterality Date  . ANKLE SURGERY Right    PT reports pins and screws are present    Family History  Family history unknown: Yes    Social History   Socioeconomic History  . Marital status: Single    Spouse name: Not on file  . Number of children: Not on file  . Years of education: Not on file  . Highest education level: Not on file  Occupational History  . Not on file  Tobacco Use  . Smoking status: Current Every Day Smoker    Types: Cigarettes  . Smokeless tobacco: Never Used  Vaping Use  . Vaping Use: Never used  Substance and Sexual Activity  . Alcohol use: Yes    Comment: QOD  . Drug use: Yes    Types: Marijuana  . Sexual activity: Yes    Birth control/protection: Condom  Other Topics Concern  . Not on file  Social History  Narrative  . Not on file   Social Determinants of Health   Financial Resource Strain:   . Difficulty of Paying Living Expenses: Not on file  Food Insecurity:   . Worried About Charity fundraiser in the Last Year: Not on file  . Ran Out of Food in the Last Year: Not on file  Transportation Needs:   . Lack of Transportation (Medical): Not on file  . Lack of Transportation (Non-Medical): Not on file  Physical Activity:   . Days of Exercise per Week: Not on file  . Minutes of Exercise per Session: Not on file  Stress:   . Feeling of Stress : Not on file  Social Connections:   . Frequency of Communication with Friends and Family: Not on file  . Frequency of Social Gatherings with Friends and Family: Not on file  . Attends Religious Services: Not on file  . Active Member of Clubs or Organizations: Not on file  . Attends Archivist Meetings: Not on file  . Marital Status: Not on file  Intimate Partner Violence:   . Fear of Current or Ex-Partner: Not on file  . Emotionally Abused: Not on file  . Physically Abused: Not on file  . Sexually Abused: Not on file    Outpatient Medications Prior to Visit  Medication Sig Dispense Refill  . blood glucose meter kit and supplies KIT Dispense based on  patient and insurance preference. Use up to four times daily as directed. (FOR ICD-9 250.00, 250.01). 1 each 0  . blood glucose meter kit and supplies KIT Dispense based on patient and insurance preference. Use up to four times daily as directed. (FOR ICD-9 250.00, 250.01). 1 each 0  . HYDROcodone-acetaminophen (NORCO/VICODIN) 5-325 MG tablet Take 2 tablets by mouth every 6 (six) hours as needed for severe pain. 15 tablet 0  . naproxen (NAPROSYN) 500 MG tablet Take 1 tablet (500 mg total) by mouth 2 (two) times daily with a meal. 30 tablet 0  . metFORMIN (GLUCOPHAGE) 500 MG tablet Take 1 tablet (500 mg total) by mouth 2 (two) times daily with a meal. 180 tablet 0   No  facility-administered medications prior to visit.    No Known Allergies  ROS Review of Systems  Eyes: Positive for visual disturbance (vision check ).      Objective:    Physical Exam Constitutional:      Appearance: He is obese.  HENT:     Head: Normocephalic and atraumatic.     Nose: Nose normal.     Mouth/Throat:     Mouth: Mucous membranes are moist.  Cardiovascular:     Rate and Rhythm: Normal rate and regular rhythm.     Pulses: Normal pulses.     Heart sounds: Normal heart sounds.  Musculoskeletal:        General: Normal range of motion.     Cervical back: Normal range of motion.  Skin:    General: Skin is warm and dry.     Capillary Refill: Capillary refill takes less than 2 seconds.  Neurological:     General: No focal deficit present.     Mental Status: He is alert and oriented to person, place, and time.  Psychiatric:        Mood and Affect: Mood normal.        Behavior: Behavior normal.        Thought Content: Thought content normal.        Judgment: Judgment normal.     BP (!) 157/91 (BP Location: Right Arm, Patient Position: Sitting, Cuff Size: Normal)   Pulse 82   Temp (!) 97.3 F (36.3 C) (Temporal)   Ht _0  (1.803 m)   Wt 252 lb (114.3 kg)   SpO2 100%   BMI 35.15 kg/m  Wt Readings from Last 3 Encounters:  07/25/20 252 lb (114.3 kg)  06/11/20 243 lb (110.2 kg)  11/19/19 262 lb 3.2 oz (118.9 kg)     There are no preventive care reminders to display for this patient.  There are no preventive care reminders to display for this patient.  Lab Results  Component Value Date   TSH 0.47 07/11/2017   Lab Results  Component Value Date   WBC 10.4 07/11/2017   HGB 14.8 07/11/2017   HCT 48.9 07/11/2017   MCV 72.7 (L) 07/11/2017   PLT 454 (H) 07/11/2017   Lab Results  Component Value Date   NA 133 (L) 06/11/2020   K 4.4 06/11/2020   CO2 21 07/11/2017   GLUCOSE 384 (H) 06/11/2020   BUN 9 06/11/2020   CREATININE 0.84 06/11/2020    BILITOT <0.2 06/11/2020   ALKPHOS 122 (H) 06/11/2020   AST 46 (H) 06/11/2020   ALT 32 07/11/2017   PROT 7.0 06/11/2020   ALBUMIN 4.6 06/11/2020   CALCIUM 9.7 06/11/2020   Lab Results  Component Value Date   CHOL 375 (  H) 06/11/2020   Lab Results  Component Value Date   HDL 41 06/11/2020   Lab Results  Component Value Date   LDLCALC 258 (H) 06/11/2020   Lab Results  Component Value Date   TRIG 343 (H) 06/11/2020   Lab Results  Component Value Date   CHOLHDL 9.1 (H) 06/11/2020   Lab Results  Component Value Date   HGBA1C 14.5 (A) 06/11/2020   HGBA1C 14.5 06/11/2020   HGBA1C 14.5 (A) 06/11/2020   HGBA1C 14.5 (A) 06/11/2020      Assessment & Plan:   Problem List Items Addressed This Visit      Endocrine   T2DM (type 2 diabetes mellitus) ([place]) - Primary Encourage compliance with current treatment regimen  Dose adjustment added crestor. Will continue with current regimen since in office CBG was improved 151 mg/dl. Patient has refused insulin  Encourage regular CBG monitoring Encourage contacting office if excessive hyperglycemia and or hypoglycemia Continue Lifestyle modification with healthy diet (fewer calories, more high fiber foods, whole grains and non-starchy vegetables, lower fat meat and fish, low-fat diary include healthy oils) regular exercise (physical activity) and weight loss Opthalmology exam discussed  Nutritional consult recommended Regular dental visits encouraged Home BP monitoring also encouraged goal <130/80     Relevant Medications   metFORMIN (GLUCOPHAGE) 500 MG tablet   rosuvastatin (CRESTOR) 20 MG tablet      Meds ordered this encounter  Medications  . metFORMIN (GLUCOPHAGE) 500 MG tablet    Sig: Take 2 tablets (1,000 mg total) by mouth 2 (two) times daily with a meal.    Dispense:  360 tablet    Refill:  0  . glucose blood (RELION TRUE METRIX TEST STRIPS) test strip    Sig: Use as instructed    Dispense:  100 each    Refill:  12     Order Specific Question:   Supervising Provider    Answer:   Tresa Garter [[id number]]  . Blood Glucose Monitoring Suppl (TRUE METRIX METER) w/Device KIT    Sig: 1 kit by Does not apply route in the morning and at bedtime.    Dispense:  1 kit    Refill:  0    Order Specific Question:   Supervising Provider    Answer:   Tresa Garter [id number]  . rosuvastatin (CRESTOR) 20 MG tablet    Sig: Take 1 tablet (20 mg total) by mouth daily.    Dispense:  90 tablet    Refill:  3    Order Specific Question:   Supervising Provider    Answer:   Tresa Garter [id number]    Follow-up: Return in about 2 months (around 09/24/2020).    Vevelyn Francois, NP

## 2020-07-30 ENCOUNTER — Encounter: Payer: Self-pay | Admitting: Nurse Practitioner

## 2020-08-19 ENCOUNTER — Telehealth: Payer: Self-pay | Admitting: Nurse Practitioner

## 2020-08-20 NOTE — Telephone Encounter (Signed)
A message was sent to the patient. He has a 3 month supply and meter sent on 07/25/20 to [facility]

## 2020-08-25 ENCOUNTER — Other Ambulatory Visit: Payer: Self-pay | Admitting: Nurse Practitioner

## 2020-08-25 MED ORDER — METFORMIN HCL 500 MG PO TABS: 1000.0000 mg | ORAL_TABLET | Freq: Two times a day (BID) | ORAL | 3 refills | Status: DC

## 2020-08-25 NOTE — Telephone Encounter (Signed)
Refill sent by provider

## 2020-09-24 ENCOUNTER — Ambulatory Visit (INDEPENDENT_AMBULATORY_CARE_PROVIDER_SITE_OTHER): Payer: 59 | Admitting: Nurse Practitioner

## 2020-09-24 ENCOUNTER — Encounter: Payer: Self-pay | Admitting: Nurse Practitioner

## 2020-09-24 ENCOUNTER — Other Ambulatory Visit: Payer: Self-pay

## 2020-09-24 VITALS — BP 148/81 | HR 79 | Temp 97.7°F | Ht 71.0 in | Wt 253.0 lb

## 2020-09-24 DIAGNOSIS — I159 Secondary hypertension, unspecified: Secondary | ICD-10-CM

## 2020-09-24 DIAGNOSIS — E119 Type 2 diabetes mellitus without complications: Secondary | ICD-10-CM

## 2020-09-24 DIAGNOSIS — F17209 Nicotine dependence, unspecified, with unspecified nicotine-induced disorders: Secondary | ICD-10-CM

## 2020-09-24 LAB — POCT GLYCOSYLATED HEMOGLOBIN (HGB A1C): Hemoglobin A1C: 7.7 % — AB (ref 4.0–5.6)

## 2020-09-24 MED ORDER — LISINOPRIL 10 MG PO TABS: 10.0000 mg | ORAL_TABLET | Freq: Every day | ORAL | 3 refills | Status: DC

## 2020-09-24 NOTE — Patient Instructions (Signed)

## 2020-09-24 NOTE — Progress Notes (Signed)
[place], [place]  [address] Phone:  [phone]   Fax:  [phone]   Established Patient Office Visit  Subjective:  Patient ID: Chris Gray, male    DOB: 10-Dec-1980  Age: 39 y.o. MRN: [id number]  CC:  Chief Complaint  Patient presents with   Follow-up    HPI Chris Gray presents for follow up. He  has no past medical history on file.   Diabetes Mellitus Patient presents for follow up of diabetes. Current symptoms include: none. Symptoms have stabilized. Patient denies foot ulcerations, increased appetite, nausea, paresthesia of the feet, polydipsia, polyuria, visual disturbances, vomiting and weight loss. Evaluation to date has included: fasting blood sugar, fasting lipid panel, hemoglobin A1C and microalbuminuria.  Home sugars: varies. Current treatment: Continued metformin which has been effective and Continued statin which has been effective. Last dilated eye exam: done. He is wearing glasses and is excited that he vision has improved.   Hypertension Patient is here for follow-up of elevated blood pressure. He is not exercising but active and is adherent to a low-salt diet. Blood pressure is not monitored at home. Cardiac symptoms: none. Patient denies chest pain, dyspnea, irregular heart beat, lower extremity edema, near-syncope, palpitations and syncope. Cardiovascular risk factors: diabetes mellitus, hypertension, male gender, obesity (BMI >= 30 kg/m2) and smoking/ tobacco exposure. Use of agents associated with hypertension: none. History of target organ damage: none.  History reviewed. No pertinent past medical history.  Past Surgical History:  Procedure Laterality Date   ANKLE SURGERY Right    PT reports pins and screws are present    Family History  Family history unknown: Yes    Social History   Socioeconomic History   Marital status: Single    Spouse name: Not on file   Number of children: Not on file    Years of education: Not on file   Highest education level: Not on file  Occupational History   Not on file  Tobacco Use   Smoking status: Current Every Day Smoker    Types: Cigarettes   Smokeless tobacco: Never Used  Vaping Use   Vaping Use: Never used  Substance and Sexual Activity   Alcohol use: Yes    Comment: QOD   Drug use: Yes    Types: Marijuana   Sexual activity: Yes    Birth control/protection: Condom  Other Topics Concern   Not on file  Social History Narrative   Not on file   Social Determinants of Health   Financial Resource Strain: Not on file  Food Insecurity: Not on file  Transportation Needs: Not on file  Physical Activity: Not on file  Stress: Not on file  Social Connections: Not on file  Intimate Partner Violence: Not on file    Outpatient Medications Prior to Visit  Medication Sig Dispense Refill   blood glucose meter kit and supplies KIT Dispense based on patient and insurance preference. Use up to four times daily as directed. (FOR ICD-9 250.00, 250.01). 1 each 0   blood glucose meter kit and supplies KIT Dispense based on patient and insurance preference. Use up to four times daily as directed. (FOR ICD-9 250.00, 250.01). 1 each 0   Blood Glucose Monitoring Suppl (TRUE METRIX METER) w/Device KIT 1 kit by Does not apply route in the morning and at bedtime. 1 kit 0   glucose blood (RELION TRUE METRIX TEST STRIPS) test strip Use as instructed 100 each 12  HYDROcodone-acetaminophen (NORCO/VICODIN) 5-325 MG tablet Take 2 tablets by mouth every 6 (six) hours as needed for severe pain. 15 tablet 0   metFORMIN (GLUCOPHAGE) 500 MG tablet Take 2 tablets (1,000 mg total) by mouth 2 (two) times daily with a meal. 360 tablet 3   naproxen (NAPROSYN) 500 MG tablet Take 1 tablet (500 mg total) by mouth 2 (two) times daily with a meal. 30 tablet 0   rosuvastatin (CRESTOR) 20 MG tablet Take 1 tablet (20 mg total) by mouth daily. 90 tablet 3   No  facility-administered medications prior to visit.    No Known Allergies  ROS Review of Systems    Objective:    Physical Exam Constitutional:      Appearance: He is obese.  HENT:     Head: Normocephalic and atraumatic.  Cardiovascular:     Rate and Rhythm: Normal rate and regular rhythm.     Pulses: Normal pulses.     Heart sounds: Normal heart sounds.  Pulmonary:     Effort: Pulmonary effort is normal.     Breath sounds: Normal breath sounds.  Abdominal:     General: Bowel sounds are normal.     Palpations: Abdomen is soft.  Musculoskeletal:        General: Normal range of motion.     Cervical back: Normal range of motion.  Skin:    General: Skin is warm.     Capillary Refill: Capillary refill takes less than 2 seconds.  Neurological:     General: No focal deficit present.     Mental Status: He is alert and oriented to person, place, and time.  Psychiatric:        Mood and Affect: Mood normal.        Behavior: Behavior normal.        Thought Content: Thought content normal.        Judgment: Judgment normal.     BP (!) 148/81    Pulse 79    Temp 97.7 F (36.5 C) (Temporal)    Ht 5' 11"  (1.803 m)    Wt 253 lb (114.8 kg)    SpO2 97%    BMI 35.29 kg/m  Wt Readings from Last 3 Encounters:  09/24/20 253 lb (114.8 kg)  07/25/20 252 lb (114.3 kg)  06/11/20 243 lb (110.2 kg)     There are no preventive care reminders to display for this patient.  There are no preventive care reminders to display for this patient.  Lab Results  Component Value Date   TSH 0.47 07/11/2017   Lab Results  Component Value Date   WBC 10.4 07/11/2017   HGB 14.8 07/11/2017   HCT 48.9 07/11/2017   MCV 72.7 (L) 07/11/2017   PLT 454 (H) 07/11/2017   Lab Results  Component Value Date   NA 133 (L) 06/11/2020   K 4.4 06/11/2020   CO2 21 07/11/2017   GLUCOSE 384 (H) 06/11/2020   BUN 9 06/11/2020   CREATININE 0.84 06/11/2020   BILITOT <0.2 06/11/2020   ALKPHOS 122 (H) 06/11/2020    AST 46 (H) 06/11/2020   ALT 32 07/11/2017   PROT 7.0 06/11/2020   ALBUMIN 4.6 06/11/2020   CALCIUM 9.7 06/11/2020   Lab Results  Component Value Date   CHOL 375 (H) 06/11/2020   Lab Results  Component Value Date   HDL 41 06/11/2020   Lab Results  Component Value Date   LDLCALC 258 (H) 06/11/2020   Lab Results  Component  Value Date   TRIG 343 (H) 06/11/2020   Lab Results  Component Value Date   CHOLHDL 9.1 (H) 06/11/2020   Lab Results  Component Value Date   HGBA1C 7.7 (A) 09/24/2020      Assessment & Plan:   Problem List Items Addressed This Visit      Cardiovascular and Mediastinum   Secondary hypertension Discussion with patient related to DM and HTN and the increased risk factors and the need treatment to decreased CV and Stroke risk  Encouraged home monitoring and recording BP <130/80 Eating a heart-healthy diet with less salt Encouraged regular physical activity  Recommend Weight loss   Relevant Medications   lisinopril (ZESTRIL) 10 MG tablet     Endocrine   T2DM (type 2 diabetes mellitus) ([place]) - Primary Hemoglobin A1C has improved significantly congratulated pt on his lifestyle modificaions and encouraged him to keep up the good work.  Encourage compliance with current treatment regimen   Encourage regular CBG monitoring Encourage contacting office if excessive hyperglycemia and or hypoglycemia Lifestyle modification with healthy diet (fewer calories, more high fiber foods, whole grains and non-starchy vegetables, lower fat meat and fish, low-fat diary include healthy oils) regular exercise (physical activity) and weight loss Ophthalmology completed Regular dental visits encouraged Home BP monitoring also encouraged goal <130/80   Relevant Medications   lisinopril (ZESTRIL) 10 MG tablet   Other Relevant Orders   HgB A1c (Completed)     Other   Tobacco use disorder, continuous      Meds ordered this encounter  Medications   lisinopril  (ZESTRIL) 10 MG tablet    Sig: Take 1 tablet (10 mg total) by mouth daily.    Dispense:  90 tablet    Refill:  3    Order Specific Question:   Supervising Provider    Answer:   Tresa Garter [id number]    Follow-up: Return in about 3 months (around 12/23/2020).    Vevelyn Francois, NP

## 2020-09-27 DIAGNOSIS — F17209 Nicotine dependence, unspecified, with unspecified nicotine-induced disorders: Secondary | ICD-10-CM | POA: Insufficient documentation

## 2020-09-27 DIAGNOSIS — I159 Secondary hypertension, unspecified: Secondary | ICD-10-CM | POA: Insufficient documentation

## 2020-10-18 ENCOUNTER — Encounter (HOSPITAL_COMMUNITY): Payer: Self-pay | Admitting: Emergency Medicine

## 2020-10-18 ENCOUNTER — Ambulatory Visit (HOSPITAL_COMMUNITY)
Admission: EM | Admit: 2020-10-18 | Discharge: 2020-10-18 | Disposition: A | Discharge status: Home / Self Care | Payer: Self-pay | Attending: Physician Assistant | Admitting: Physician Assistant

## 2020-10-18 DIAGNOSIS — L02219 Cutaneous abscess of trunk, unspecified: Secondary | ICD-10-CM

## 2020-10-18 HISTORY — DX: Essential (primary) hypertension: I10

## 2020-10-18 HISTORY — DX: Type 2 diabetes mellitus without complications: E11.9

## 2020-10-18 MED ORDER — LIDOCAINE HCL 2 % IJ SOLN
INTRAMUSCULAR | Status: AC
  Filled 2020-10-18: qty 20

## 2020-10-18 MED ORDER — DOXYCYCLINE HYCLATE 100 MG PO CAPS
100.0000 mg | ORAL_CAPSULE | Freq: Two times a day (BID) | ORAL | 0 refills | Status: DC
Start: 2020-10-18 — End: 2021-04-23

## 2020-10-18 MED ORDER — LIDOCAINE HCL (PF) 1 % IJ SOLN: 5.0000 mL | Freq: Once | INTRAMUSCULAR | Status: DC

## 2020-10-18 NOTE — ED Triage Notes (Signed)
Pt presents with "knot" on back. Denies any drainage at this time. States ibuprofen gives little relief.

## 2020-10-18 NOTE — Discharge Instructions (Signed)
Return if any problems.

## 2020-10-18 NOTE — ED Provider Notes (Signed)
[facility]    CSN: [id number] Arrival date & time: 10/18/20  1341      History   Chief Complaint Chief Complaint  Patient presents with  . Back Pain    HPI Chris Gray is a 40 y.o. male.   The history is provided by the patient. No language interpreter was used.  Abscess Abscess location: back. Size:  2 Abscess quality: painful, redness and warmth   Pain details:    Quality:  Pressure   Severity:  Moderate   Duration:  3 days   Timing:  Constant   Progression:  Worsening Chronicity:  New Relieved by:  Nothing Worsened by:  Nothing Ineffective treatments:  None tried   Past Medical History:  Diagnosis Date  . Diabetes mellitus without complication ([place])   . Hypertension     Patient Active Problem List   Diagnosis Date Noted  . Tobacco use disorder, continuous 09/27/2020  . Secondary hypertension 09/27/2020  . T2DM (type 2 diabetes mellitus) ([place]) 07/17/2017  . MRSA (methicillin resistant staph aureus) culture positive 07/14/2017    Past Surgical History:  Procedure Laterality Date  . ANKLE SURGERY Right    PT reports pins and screws are present       Home Medications    Prior to Admission medications   Medication Sig Start Date End Date Taking? Authorizing Provider  doxycycline (VIBRAMYCIN) 100 MG capsule Take 1 capsule (100 mg total) by mouth 2 (two) times daily. 10/18/20  Yes Caryl Ada K, PA-C  blood glucose meter kit and supplies KIT Dispense based on patient and insurance preference. Use up to four times daily as directed. (FOR ICD-9 250.00, 250.01). 06/12/20   Vevelyn Francois, NP  blood glucose meter kit and supplies KIT Dispense based on patient and insurance preference. Use up to four times daily as directed. (FOR ICD-9 250.00, 250.01). 06/23/20   Vevelyn Francois, NP  Blood Glucose Monitoring Suppl (TRUE METRIX METER) w/Device KIT 1 kit by Does not apply route in the morning and at bedtime. 07/25/20 07/25/21  Vevelyn Francois, NP   glucose blood (RELION TRUE METRIX TEST STRIPS) test strip Use as instructed 07/25/20   Vevelyn Francois, NP  HYDROcodone-acetaminophen (NORCO/VICODIN) 5-325 MG tablet Take 2 tablets by mouth every 6 (six) hours as needed for severe pain. 07/14/20   Jaynee Eagles, PA-C  lisinopril (ZESTRIL) 10 MG tablet Take 1 tablet (10 mg total) by mouth daily. 09/24/20   Vevelyn Francois, NP  metFORMIN (GLUCOPHAGE) 500 MG tablet Take 2 tablets (1,000 mg total) by mouth 2 (two) times daily with a meal. 08/25/20 08/25/21  Vevelyn Francois, NP  naproxen (NAPROSYN) 500 MG tablet Take 1 tablet (500 mg total) by mouth 2 (two) times daily with a meal. 07/14/20   Jaynee Eagles, PA-C  rosuvastatin (CRESTOR) 20 MG tablet Take 1 tablet (20 mg total) by mouth daily. 07/25/20   Vevelyn Francois, NP    Family History Family History  Family history unknown: Yes    Social History Social History   Tobacco Use  . Smoking status: Current Every Day Smoker    Types: Cigarettes  . Smokeless tobacco: Never Used  Vaping Use  . Vaping Use: Never used  Substance Use Topics  . Alcohol use: Yes    Comment: QOD  . Drug use: Yes    Types: Marijuana     Allergies   Patient has no known allergies.   Review of Systems Review of Systems  All other systems reviewed and are negative.    Physical Exam Triage Vital Signs ED Triage Vitals  Enc Vitals Group     BP 10/18/20 1526 (!) 170/85     Pulse Rate 10/18/20 1526 85     Resp 10/18/20 1526 17     Temp 10/18/20 1526 98.5 F (36.9 C)     Temp Source 10/18/20 1526 Oral     SpO2 10/18/20 1526 100 %     Weight --      Height --      Head Circumference --      Peak Flow --      Pain Score 10/18/20 1524 10     Pain Loc --      Pain Edu? --      Excl. in [place]? --    No data found.  Updated Vital Signs BP (!) 170/85 (BP Location: Right Arm)   Pulse 85   Temp 98.5 F (36.9 C) (Oral)   Resp 17   SpO2 100%   Visual Acuity Right Eye Distance:   Left Eye Distance:    Bilateral Distance:    Right Eye Near:   Left Eye Near:    Bilateral Near:     Physical Exam Vitals and nursing note reviewed.  Constitutional:      Appearance: He is well-developed and well-nourished.  HENT:     Head: Normocephalic and atraumatic.  Eyes:     Conjunctiva/sclera: Conjunctivae normal.  Cardiovascular:     Rate and Rhythm: Normal rate and regular rhythm.     Heart sounds: No murmur heard.   Pulmonary:     Effort: Pulmonary effort is normal. No respiratory distress.     Breath sounds: Normal breath sounds.  Abdominal:     Palpations: Abdomen is soft.     Tenderness: There is no abdominal tenderness.  Musculoskeletal:        General: No edema.     Cervical back: Neck supple.  Skin:    General: Skin is warm and dry.  Neurological:     Mental Status: He is alert.  Psychiatric:        Mood and Affect: Mood and affect and mood normal.      UC Treatments / Results  Labs (all labs ordered are listed, but only abnormal results are displayed) Labs Reviewed - No data to display  EKG   Radiology No results found.  Procedures Incision and Drainage  Date/Time: 10/18/2020 5:18 PM Performed by: Fransico Meadow, PA-C Authorized by: Fransico Meadow, PA-C   Consent:    Consent obtained:  Verbal   Consent given by:  Patient   Risks discussed:  Bleeding, incomplete drainage, pain and damage to other organs   Alternatives discussed:  No treatment Universal protocol:    Procedure explained and questions answered to patient or proxy's satisfaction: yes     Relevant documents present and verified: yes     Test results available : yes     Imaging studies available: yes     Required blood products, implants, devices, and special equipment available: yes     Site/side marked: yes     Immediately prior to procedure, a time out was called: yes     Patient identity confirmed:  Verbally with patient Location:    Type:  Abscess   Size:  2 Pre-procedure details:     Skin preparation:  Betadine Anesthesia:    Anesthesia method:  Local infiltration   Local  anesthetic:  Lidocaine 1% WITH epi Procedure type:    Complexity:  Complex Procedure details:    Incision types:  Single straight   Incision depth:  Subcutaneous   Scalpel blade:  11   Wound management:  Probed and deloculated, irrigated with saline and extensive cleaning   Drainage:  Purulent   Drainage amount:  Moderate   Wound treatment:  Wound left open Post-procedure details:    Procedure completion:  Tolerated well, no immediate complications   (including critical care time)  Medications Ordered in UC Medications - No data to display  Initial Impression / Assessment and Plan / UC Course  I have reviewed the triage vital signs and the nursing notes.  Pertinent labs & imaging results that were available during my care of the patient were reviewed by me and considered in my medical decision making (see chart for details).     MDM:  Pt advised to soak area.  Pt given Rx for doxycycline.  Recheck in 2 days if not improved.  Final Clinical Impressions(s) / UC Diagnoses   Final diagnoses:  Cutaneous abscess of trunk, unspecified site of trunk     Discharge Instructions     Return if any problems.     ED Prescriptions    Medication Sig Dispense Auth. Provider   doxycycline (VIBRAMYCIN) 100 MG capsule Take 1 capsule (100 mg total) by mouth 2 (two) times daily. 20 capsule Fransico Meadow, [place]     PDMP not reviewed this encounter.  An After Visit Summary was printed and given to the patient.    Fransico Meadow, [place] 10/18/20 1719

## 2020-11-10 ENCOUNTER — Other Ambulatory Visit: Payer: Self-pay

## 2020-11-10 DIAGNOSIS — Z20822 Contact with and (suspected) exposure to covid-19: Secondary | ICD-10-CM

## 2020-11-11 LAB — SARS-COV-2, NAA 2 DAY TAT

## 2020-11-11 LAB — NOVEL CORONAVIRUS, NAA: SARS-CoV-2, NAA: NOT DETECTED

## 2020-12-24 ENCOUNTER — Other Ambulatory Visit: Payer: Self-pay

## 2020-12-24 ENCOUNTER — Encounter: Payer: Self-pay | Admitting: Nurse Practitioner

## 2020-12-24 ENCOUNTER — Ambulatory Visit (INDEPENDENT_AMBULATORY_CARE_PROVIDER_SITE_OTHER): Payer: Self-pay | Admitting: Nurse Practitioner

## 2020-12-24 VITALS — BP 148/71 | HR 87 | Temp 97.1°F | Ht 71.0 in | Wt 249.6 lb

## 2020-12-24 DIAGNOSIS — E119 Type 2 diabetes mellitus without complications: Secondary | ICD-10-CM

## 2020-12-24 DIAGNOSIS — E782 Mixed hyperlipidemia: Secondary | ICD-10-CM

## 2020-12-24 DIAGNOSIS — L309 Dermatitis, unspecified: Secondary | ICD-10-CM

## 2020-12-24 DIAGNOSIS — Z130 Encounter for screening for diseases of the blood and blood-forming organs and certain disorders involving the immune mechanism: Secondary | ICD-10-CM

## 2020-12-24 LAB — POCT URINALYSIS DIPSTICK
Bilirubin, UA: NEGATIVE
Blood, UA: NEGATIVE
Glucose, UA: POSITIVE — AB
Ketones, UA: NEGATIVE
Nitrite, UA: NEGATIVE
Protein, UA: NEGATIVE
Spec Grav, UA: 1.025 (ref 1.010–1.025)
Urobilinogen, UA: 0.2 E.U./dL
pH, UA: 6.5 (ref 5.0–8.0)

## 2020-12-24 LAB — POCT GLYCOSYLATED HEMOGLOBIN (HGB A1C)
HbA1c POC (<> result, manual entry): 7.7 % (ref 4.0–5.6)
HbA1c, POC (controlled diabetic range): 7.7 % — AB (ref 0.0–7.0)
HbA1c, POC (prediabetic range): 7.7 % — AB (ref 5.7–6.4)
Hemoglobin A1C: 7.7 % — AB (ref 4.0–5.6)

## 2020-12-24 MED ORDER — TRIAMCINOLONE ACETONIDE 0.1 % EX CREA: 1.0000 "application " | TOPICAL_CREAM | Freq: Two times a day (BID) | CUTANEOUS | 0 refills | Status: DC

## 2020-12-24 MED ORDER — METFORMIN HCL 500 MG PO TABS: 1000.0000 mg | ORAL_TABLET | Freq: Two times a day (BID) | ORAL | 3 refills | Status: DC

## 2020-12-24 NOTE — Progress Notes (Signed)
[facility] [place], [place]  [address] Phone:  [phone]   Fax:  [phone]   Established Patient Office Visit  Subjective:  Patient ID: Chris Gray, male    DOB: 10-23-1980  Age: 40 y.o. MRN: [id number]  CC:  Chief Complaint  Patient presents with  . Follow-up    3 month follow up diabetes     HPI Chris Gray presents for diabetes. He  has a past medical history of Diabetes mellitus without complication ([place]) and Hypertension.   Diabetes Mellitus Patient presents for follow up of diabetes. Current symptoms include: none. Symptoms have stabilized. Patient denies foot ulcerations, hypoglycemia , increased appetite, nausea, paresthesia of the feet, polydipsia, polyuria, visual disturbances and vomiting. Evaluation to date has included: fasting blood sugar, fasting lipid panel, hemoglobin A1C and microalbuminuria.  Home sugars: BGs are running  consistent with Hgb A1C. Current treatment: Continued metformin which has been effective, Continued statin which has been unable to assess effectiveness and Continued ACE inhibitor/ARB which has been somewhat effective. He admits that he is feeling well overall. He denies any concerns today. He admits that occasional he does eat something outside of his list. He has lost about 4 pounds since his last visit.   Past Medical History:  Diagnosis Date  . Diabetes mellitus without complication ([place])   . Hypertension     Past Surgical History:  Procedure Laterality Date  . ANKLE SURGERY Right    PT reports pins and screws are present    Family History  Family history unknown: Yes    Social History   Socioeconomic History  . Marital status: Single    Spouse name: Not on file  . Number of children: Not on file  . Years of education: Not on file  . Highest education level: Not on file  Occupational History  . Not on file  Tobacco Use  . Smoking status: Current Every Day Smoker    Types:  Cigarettes  . Smokeless tobacco: Never Used  Vaping Use  . Vaping Use: Never used  Substance and Sexual Activity  . Alcohol use: Yes    Comment: QOD  . Drug use: Yes    Types: Marijuana  . Sexual activity: Yes    Birth control/protection: Condom  Other Topics Concern  . Not on file  Social History Narrative  . Not on file   Social Determinants of Health   Financial Resource Strain: Not on file  Food Insecurity: Not on file  Transportation Needs: Not on file  Physical Activity: Not on file  Stress: Not on file  Social Connections: Not on file  Intimate Partner Violence: Not on file    Outpatient Medications Prior to Visit  Medication Sig Dispense Refill  . blood glucose meter kit and supplies KIT Dispense based on patient and insurance preference. Use up to four times daily as directed. (FOR ICD-9 250.00, 250.01). 1 each 0  . doxycycline (VIBRAMYCIN) 100 MG capsule Take 1 capsule (100 mg total) by mouth 2 (two) times daily. 20 capsule 0  . rosuvastatin (CRESTOR) 20 MG tablet Take 1 tablet (20 mg total) by mouth daily. 90 tablet 3  . metFORMIN (GLUCOPHAGE) 500 MG tablet Take 2 tablets (1,000 mg total) by mouth 2 (two) times daily with a meal. 360 tablet 3  . Blood Glucose Monitoring Suppl (TRUE METRIX METER) w/Device KIT 1 kit by Does not apply route in the morning and at bedtime. 1 kit  0  . glucose blood (RELION TRUE METRIX TEST STRIPS) test strip Use as instructed 100 each 12  . lisinopril (ZESTRIL) 10 MG tablet Take 1 tablet (10 mg total) by mouth daily. (Patient not taking: Reported on 12/24/2020) 90 tablet 3  . blood glucose meter kit and supplies KIT Dispense based on patient and insurance preference. Use up to four times daily as directed. (FOR ICD-9 250.00, 250.01). 1 each 0  . HYDROcodone-acetaminophen (NORCO/VICODIN) 5-325 MG tablet Take 2 tablets by mouth every 6 (six) hours as needed for severe pain. 15 tablet 0  . naproxen (NAPROSYN) 500 MG tablet Take 1 tablet (500 mg  total) by mouth 2 (two) times daily with a meal. 30 tablet 0   No facility-administered medications prior to visit.    No Known Allergies  ROS Review of Systems  All other systems reviewed and are negative.     Objective:    Physical Exam Constitutional:      Appearance: He is obese.  HENT:     Head: Normocephalic and atraumatic.     Nose: Nose normal.     Mouth/Throat:     Mouth: Mucous membranes are moist.  Cardiovascular:     Rate and Rhythm: Normal rate and regular rhythm.     Pulses: Normal pulses.  Pulmonary:     Effort: Pulmonary effort is normal.     Breath sounds: Normal breath sounds.  Abdominal:     Palpations: Abdomen is soft.  Musculoskeletal:        General: Normal range of motion.     Cervical back: Normal range of motion.  Skin:    Capillary Refill: Capillary refill takes less than 2 seconds.     Findings: Erythema and rash present.  Neurological:     General: No focal deficit present.     Mental Status: He is alert and oriented to person, place, and time.  Psychiatric:        Mood and Affect: Mood normal.        Behavior: Behavior normal.        Thought Content: Thought content normal.        Judgment: Judgment normal.     BP (!) 148/71 (BP Location: Left Arm, Patient Position: Sitting, Cuff Size: Large)   Pulse 87   Temp (!) 97.1 F (36.2 C) (Temporal)   Ht _0  (1.803 m)   Wt 249 lb 9.6 oz (113.2 kg)   SpO2 97%   BMI 34.81 kg/m  Wt Readings from Last 3 Encounters:  12/24/20 249 lb 9.6 oz (113.2 kg)  09/24/20 253 lb (114.8 kg)  07/25/20 252 lb (114.3 kg)     There are no preventive care reminders to display for this patient.  There are no preventive care reminders to display for this patient.  Lab Results  Component Value Date   TSH 0.47 07/11/2017   Lab Results  Component Value Date   WBC 10.4 07/11/2017   HGB 14.8 07/11/2017   HCT 48.9 07/11/2017   MCV 72.7 (L) 07/11/2017   PLT 454 (H) 07/11/2017   Lab Results   Component Value Date   NA 133 (L) 06/11/2020   K 4.4 06/11/2020   CO2 21 07/11/2017   GLUCOSE 384 (H) 06/11/2020   BUN 9 06/11/2020   CREATININE 0.84 06/11/2020   BILITOT <0.2 06/11/2020   ALKPHOS 122 (H) 06/11/2020   AST 46 (H) 06/11/2020   ALT 32 07/11/2017   PROT 7.0 06/11/2020   ALBUMIN  4.6 06/11/2020   CALCIUM 9.7 06/11/2020   Lab Results  Component Value Date   CHOL 375 (H) 06/11/2020   Lab Results  Component Value Date   HDL 41 06/11/2020   Lab Results  Component Value Date   LDLCALC 258 (H) 06/11/2020   Lab Results  Component Value Date   TRIG 343 (H) 06/11/2020   Lab Results  Component Value Date   CHOLHDL 9.1 (H) 06/11/2020   Lab Results  Component Value Date   HGBA1C 7.7 (A) 12/24/2020   HGBA1C 7.7 12/24/2020   HGBA1C 7.7 (A) 12/24/2020   HGBA1C 7.7 (A) 12/24/2020      Assessment & Plan:   Problem List Items Addressed This Visit      Endocrine   T2DM (type 2 diabetes mellitus) ([place]) - Primary Fasting labs to be completed on Friday Encourage compliance with current treatment regimen Encourage regular CBG monitoring Encourage contacting office if excessive hyperglycemia and or hypoglycemia Lifestyle modification with healthy diet (fewer calories, more high fiber foods, whole grains and non-starchy vegetables, lower fat meat and fish, low-fat diary include healthy oils) regular exercise (physical activity) and weight loss Opthalmology exam discussed  Nutritional consult recommended Regular dental visits encouraged Home BP monitoring also encouraged goal <130/80     Relevant Medications   metFORMIN (GLUCOPHAGE) 500 MG tablet   Other Relevant Orders   POCT Urinalysis Dipstick (Completed)   HgB A1c (Completed)   Comp. Metabolic Panel (12)    Other Visit Diagnoses    Screening for iron deficiency anemia       Relevant Orders   CBC with Differential/Platelet   Mixed hyperlipidemia     Labs pending encourage low fat, cholesterol diet.     Eczema, unspecified type     Worsening Trial of Triamcinolone 0.1% BID       Meds ordered this encounter  Medications  . metFORMIN (GLUCOPHAGE) 500 MG tablet    Sig: Take 2 tablets (1,000 mg total) by mouth 2 (two) times daily with a meal.    Dispense:  360 tablet    Refill:  3    Order Specific Question:   Supervising Provider    Answer:   Tresa Garter [id number]  . triamcinolone (KENALOG) 0.1 %    Sig: Apply 1 application topically 2 (two) times daily.    Dispense:  456.6 g    Refill:  0    Order Specific Question:   Supervising Provider    Answer:   Tresa Garter [id number]    Follow-up: Return in about 3 months (around 03/26/2021), or lab only on friday, for follow up [id number].    Vevelyn Francois, NP

## 2020-12-24 NOTE — Patient Instructions (Signed)
Diabetes Mellitus and Nutrition, Adult When you have diabetes, or diabetes mellitus, it is very important to have healthy eating habits because your blood sugar (glucose) levels are greatly affected by what you eat and drink. Eating healthy foods in the right amounts, at about the same times every day, can help you:  Control your blood glucose.  Lower your risk of heart disease.  Improve your blood pressure.  Reach or maintain a healthy weight. What can affect my meal plan? Every person with diabetes is different, and each person has different needs for a meal plan. Your health care provider may recommend that you work with a dietitian to make a meal plan that is best for you. Your meal plan may vary depending on factors such as:  The calories you need.  The medicines you take.  Your weight.  Your blood glucose, blood pressure, and cholesterol levels.  Your activity level.  Other health conditions you have, such as heart or kidney disease. How do carbohydrates affect me? Carbohydrates, also called carbs, affect your blood glucose level more than any other type of food. Eating carbs naturally raises the amount of glucose in your blood. Carb counting is a method for keeping track of how many carbs you eat. Counting carbs is important to keep your blood glucose at a healthy level, especially if you use insulin or take certain oral diabetes medicines. It is important to know how many carbs you can safely have in each meal. This is different for every person. Your dietitian can help you calculate how many carbs you should have at each meal and for each snack. How does alcohol affect me? Alcohol can cause a sudden decrease in blood glucose (hypoglycemia), especially if you use insulin or take certain oral diabetes medicines. Hypoglycemia can be a life-threatening condition. Symptoms of hypoglycemia, such as sleepiness, dizziness, and confusion, are similar to symptoms of having too much  alcohol.  Do not drink alcohol if: ? Your health care provider tells you not to drink. ? You are pregnant, may be pregnant, or are planning to become pregnant.  If you drink alcohol: ? Do not drink on an empty stomach. ? Limit how much you use to:  0-1 drink a day for women.  0-2 drinks a day for men. ? Be aware of how much alcohol is in your drink. In the [place] one drink equals one 12 oz bottle of beer (355 mL), one 5 oz glass of wine (148 mL), or one 1 oz glass of hard liquor (44 mL). ? Keep yourself hydrated with water, diet soda, or unsweetened iced tea.  Keep in mind that regular soda, juice, and other mixers may contain a lot of sugar and must be counted as carbs. What are tips for following this plan? Reading food labels  Start by checking the serving size on the "Nutrition Facts" label of packaged foods and drinks. The amount of calories, carbs, fats, and other nutrients listed on the label is based on one serving of the item. Many items contain more than one serving per package.  Check the total grams (g) of carbs in one serving. You can calculate the number of servings of carbs in one serving by dividing the total carbs by 15. For example, if a food has 30 g of total carbs per serving, it would be equal to 2 servings of carbs.  Check the number of grams (g) of saturated fats and trans fats in one serving. Choose foods that have   a low amount or none of these fats.  Check the number of milligrams (mg) of salt (sodium) in one serving. Most people should limit total sodium intake to less than 2,300 mg per day.  Always check the nutrition information of foods labeled as "low-fat" or "nonfat." These foods may be higher in added sugar or refined carbs and should be avoided.  Talk to your dietitian to identify your daily goals for nutrients listed on the label. Shopping  Avoid buying canned, pre-made, or processed foods. These foods tend to be high in fat, sodium, and added  sugar.  Shop around the outside edge of the grocery store. This is where you will most often find fresh fruits and vegetables, bulk grains, fresh meats, and fresh dairy. Cooking  Use low-heat cooking methods, such as baking, instead of high-heat cooking methods like deep frying.  Cook using healthy oils, such as olive, canola, or sunflower oil.  Avoid cooking with butter, cream, or high-fat meats. Meal planning  Eat meals and snacks regularly, preferably at the same times every day. Avoid going long periods of time without eating.  Eat foods that are high in fiber, such as fresh fruits, vegetables, beans, and whole grains. Talk with your dietitian about how many servings of carbs you can eat at each meal.  Eat 4-6 oz (112-168 g) of lean protein each day, such as lean meat, chicken, fish, eggs, or tofu. One ounce (oz) of lean protein is equal to: ? 1 oz (28 g) of meat, chicken, or fish. ? 1 egg. ?  cup (62 g) of tofu.  Eat some foods each day that contain healthy fats, such as avocado, nuts, seeds, and fish.   What foods should I eat? Fruits Berries. Apples. Oranges. Peaches. Apricots. Plums. Grapes. Mango. Papaya. Pomegranate. Kiwi. Cherries. Vegetables Lettuce. Spinach. Leafy greens, including kale, chard, collard greens, and mustard greens. Beets. Cauliflower. Cabbage. Broccoli. Carrots. Green beans. Tomatoes. Peppers. Onions. Cucumbers. Brussels sprouts. Grains Whole grains, such as whole-wheat or whole-grain bread, crackers, tortillas, cereal, and pasta. Unsweetened oatmeal. Quinoa. Brown or wild rice. Meats and other proteins Seafood. Poultry without skin. Lean cuts of poultry and beef. Tofu. Nuts. Seeds. Dairy Low-fat or fat-free dairy products such as milk, yogurt, and cheese. The items listed above may not be a complete list of foods and beverages you can eat. Contact a dietitian for more information. What foods should I avoid? Fruits Fruits canned with  syrup. Vegetables Canned vegetables. Frozen vegetables with butter or cream sauce. Grains Refined white flour and flour products such as bread, pasta, snack foods, and cereals. Avoid all processed foods. Meats and other proteins Fatty cuts of meat. Poultry with skin. Breaded or fried meats. Processed meat. Avoid saturated fats. Dairy Full-fat yogurt, cheese, or milk. Beverages Sweetened drinks, such as soda or iced tea. The items listed above may not be a complete list of foods and beverages you should avoid. Contact a dietitian for more information. Questions to ask a health care provider  Do I need to meet with a diabetes educator?  Do I need to meet with a dietitian?  What number can I call if I have questions?  When are the best times to check my blood glucose? Where to find more information:  American Diabetes Association: diabetes.org  Academy of Nutrition and Dietetics: www.eatright.org  National Institute of Diabetes and Digestive and Kidney Diseases: www.niddk.nih.gov  Association of Diabetes Care and Education Specialists: www.diabeteseducator.org Summary  It is important to have healthy eating   habits because your blood sugar (glucose) levels are greatly affected by what you eat and drink.  A healthy meal plan will help you control your blood glucose and maintain a healthy lifestyle.  Your health care provider may recommend that you work with a dietitian to make a meal plan that is best for you.  Keep in mind that carbohydrates (carbs) and alcohol have immediate effects on your blood glucose levels. It is important to count carbs and to use alcohol carefully. This information is not intended to replace advice given to you by your health care provider. Make sure you discuss any questions you have with your health care provider. Document Revised: 09/04/2019 Document Reviewed: 09/04/2019 Elsevier Patient Education  2021 Elsevier Inc.  

## 2020-12-26 ENCOUNTER — Other Ambulatory Visit: Payer: Self-pay

## 2021-01-14 ENCOUNTER — Other Ambulatory Visit: Payer: Self-pay

## 2021-01-14 ENCOUNTER — Ambulatory Visit (HOSPITAL_COMMUNITY)
Admission: EM | Admit: 2021-01-14 | Discharge: 2021-01-14 | Disposition: A | Discharge status: Home / Self Care | Payer: Self-pay | Attending: Family Medicine | Admitting: Family Medicine

## 2021-01-14 ENCOUNTER — Encounter (HOSPITAL_COMMUNITY): Payer: Self-pay

## 2021-01-14 DIAGNOSIS — H1032 Unspecified acute conjunctivitis, left eye: Secondary | ICD-10-CM

## 2021-01-14 MED ORDER — POLYMYXIN B-TRIMETHOPRIM 10000-0.1 UNIT/ML-% OP SOLN: 1.0000 [drp] | Freq: Four times a day (QID) | OPHTHALMIC | 0 refills | Status: DC

## 2021-01-14 NOTE — ED Provider Notes (Signed)
[place]    CSN: [id number] Arrival date & time: 01/14/21  1631      History   Chief Complaint Chief Complaint  Patient presents with  . Eye Problem    HPI Chris Gray is a 40 y.o. male.   Patient presenting today with 2-day history of left eye redness, drainage, itching.  States he has had no injury to the eye, does not wear contacts, no recent use of chemicals.  Denies visual change, headache, dizziness, fever, chills, recent illness, sick contacts.  Not trying anything over-the-counter for symptoms.     Past Medical History:  Diagnosis Date  . Diabetes mellitus without complication ([place])   . Hypertension     Patient Active Problem List   Diagnosis Date Noted  . Tobacco use disorder, continuous 09/27/2020  . Secondary hypertension 09/27/2020  . T2DM (type 2 diabetes mellitus) ([place]) 07/17/2017  . MRSA (methicillin resistant staph aureus) culture positive 07/14/2017    Past Surgical History:  Procedure Laterality Date  . ANKLE SURGERY Right    PT reports pins and screws are present       Home Medications    Prior to Admission medications   Medication Sig Start Date End Date Taking? Authorizing Provider  metFORMIN (GLUCOPHAGE) 500 MG tablet Take 2 tablets (1,000 mg total) by mouth 2 (two) times daily with a meal. 12/24/20 12/24/21 Yes King, Diona Foley, NP  rosuvastatin (CRESTOR) 20 MG tablet Take 1 tablet (20 mg total) by mouth daily. 07/25/20  Yes King, Diona Foley, NP  triamcinolone (KENALOG) 0.1 % Apply 1 application topically 2 (two) times daily. 12/24/20  Yes Vevelyn Francois, NP  trimethoprim-polymyxin b (POLYTRIM) ophthalmic solution Place 1 drop into the left eye every 6 (six) hours. 01/14/21  Yes Volney American, PA-C  blood glucose meter kit and supplies KIT Dispense based on patient and insurance preference. Use up to four times daily as directed. (FOR ICD-9 250.00, 250.01). 06/12/20   Vevelyn Francois, NP  Blood Glucose Monitoring Suppl  (TRUE METRIX METER) w/Device KIT 1 kit by Does not apply route in the morning and at bedtime. 07/25/20 07/25/21  Vevelyn Francois, NP  doxycycline (VIBRAMYCIN) 100 MG capsule Take 1 capsule (100 mg total) by mouth 2 (two) times daily. 10/18/20   Fransico Meadow, PA-C  glucose blood (RELION TRUE METRIX TEST STRIPS) test strip Use as instructed 07/25/20   Vevelyn Francois, NP  lisinopril (ZESTRIL) 10 MG tablet Take 1 tablet (10 mg total) by mouth daily. Patient not taking: No sig reported 09/24/20   Vevelyn Francois, NP    Family History Family History  Problem Relation Age of Onset  . Healthy Mother   . Heart attack Father     Social History Social History   Tobacco Use  . Smoking status: Current Every Day Smoker    Types: Cigarettes  . Smokeless tobacco: Never Used  . Tobacco comment: occasionally  Vaping Use  . Vaping Use: Never used  Substance Use Topics  . Alcohol use: Yes    Comment: QOD  . Drug use: Yes    Types: Marijuana     Allergies   Patient has no known allergies.   Review of Systems Review of Systems Per HPI Physical Exam Triage Vital Signs ED Triage Vitals  Enc Vitals Group     BP 01/14/21 1647 (!) 154/94     Pulse Rate 01/14/21 1647 87     Resp 01/14/21 1647 18  Temp 01/14/21 1647 98.7 F (37.1 C)     Temp Source 01/14/21 1647 Oral     SpO2 01/14/21 1647 99 %     Weight --      Height --      Head Circumference --      Peak Flow --      Pain Score 01/14/21 1644 0     Pain Loc --      Pain Edu? --      Excl. in [place]? --    No data found.  Updated Vital Signs BP (!) 154/94 (BP Location: Left Arm)   Pulse 87   Temp 98.7 F (37.1 C) (Oral)   Resp 18   SpO2 99%   Visual Acuity Right Eye Distance:   Left Eye Distance:   Bilateral Distance:    Right Eye Near:   Left Eye Near:    Bilateral Near:     Physical Exam Vitals and nursing note reviewed.  Constitutional:      Appearance: Normal appearance.  HENT:     Head: Atraumatic.   Eyes:     Extraocular Movements: Extraocular movements intact.     Pupils: Pupils are equal, round, and reactive to light.     Comments: Left conjunctiva is erythematous, injected, draining Right conjunctive a benign  Cardiovascular:     Rate and Rhythm: Normal rate and regular rhythm.  Pulmonary:     Effort: Pulmonary effort is normal.     Breath sounds: Normal breath sounds.  Musculoskeletal:        General: Normal range of motion.     Cervical back: Normal range of motion and neck supple.  Skin:    General: Skin is warm and dry.  Neurological:     General: No focal deficit present.     Mental Status: He is oriented to person, place, and time.  Psychiatric:        Mood and Affect: Mood normal.        Thought Content: Thought content normal.        Judgment: Judgment normal.      UC Treatments / Results  Labs (all labs ordered are listed, but only abnormal results are displayed) Labs Reviewed - No data to display  EKG   Radiology No results found.  Procedures Procedures (including critical care time)  Medications Ordered in UC Medications - No data to display  Initial Impression / Assessment and Plan / UC Course  I have reviewed the triage vital signs and the nursing notes.  Pertinent labs & imaging results that were available during my care of the patient were reviewed by me and considered in my medical decision making (see chart for details).     Suspect conjunctivitis, he declines visual acuity testing as his vision is fully intact per patient.  We will treat with Polytrim drops, warm compresses.  Work note given.  Follow-up with ophthalmology if not fully resolving.  Final Clinical Impressions(s) / UC Diagnoses   Final diagnoses:  Acute bacterial conjunctivitis of left eye   Discharge Instructions   None    ED Prescriptions    Medication Sig Dispense Auth. Provider   trimethoprim-polymyxin b (POLYTRIM) ophthalmic solution Place 1 drop into the  left eye every 6 (six) hours. 10 mL Volney American, [place]     PDMP not reviewed this encounter.   Volney American, [place] 01/14/21 1723

## 2021-01-14 NOTE — ED Triage Notes (Signed)
Pt with redness and itching to left eye for two days. Pt reports watery drainage but no pain and no affect on his vision.

## 2021-04-23 ENCOUNTER — Encounter (HOSPITAL_COMMUNITY): Payer: Self-pay | Admitting: *Deleted

## 2021-04-23 ENCOUNTER — Ambulatory Visit (HOSPITAL_COMMUNITY)
Admission: EM | Admit: 2021-04-23 | Discharge: 2021-04-23 | Disposition: A | Discharge status: Home / Self Care | Payer: Self-pay | Attending: Emergency Medicine | Admitting: Emergency Medicine

## 2021-04-23 ENCOUNTER — Other Ambulatory Visit: Payer: Self-pay

## 2021-04-23 DIAGNOSIS — K0889 Other specified disorders of teeth and supporting structures: Secondary | ICD-10-CM

## 2021-04-23 DIAGNOSIS — K047 Periapical abscess without sinus: Secondary | ICD-10-CM

## 2021-04-23 MED ORDER — AMOXICILLIN-POT CLAVULANATE 875-125 MG PO TABS: 1.0000 | ORAL_TABLET | Freq: Two times a day (BID) | ORAL | 0 refills | Status: DC

## 2021-04-23 MED ORDER — MELOXICAM 15 MG PO TABS: 15.0000 mg | ORAL_TABLET | Freq: Every day | ORAL | 0 refills | Status: DC

## 2021-04-23 NOTE — Discharge Instructions (Signed)
Take antibiotic twice a day for 7 day  Can use meloxicam every morning with food for 5 days then as needed  Can use tylenol 650 mg every 6 hours as needed for comfort while using meloxciam  Dental resources are attached, please follow up with dentist as soon as possible for evaluation

## 2021-04-23 NOTE — ED Triage Notes (Signed)
Pt reports dental pain started 2 days ago 

## 2021-04-23 NOTE — ED Provider Notes (Signed)
[place]    CSN: [id number] Arrival date & time: 04/23/21  1620      History   Chief Complaint Chief Complaint  Patient presents with   Dental Pain    HPI Chris Gray is a 40 y.o. male.   Patient present with left upper and lower dental pain beginning two days ago. Attest to teeth being broken. Has not seen a dentist in years due to the financial strain. Denies fever or chills. Using ibuprofen which provides some relief but does not last. Current smoker, history of DM and HTN.   Past Medical History:  Diagnosis Date   Diabetes mellitus without complication ([place])    Hypertension     Patient Active Problem List   Diagnosis Date Noted   Tobacco use disorder, continuous 09/27/2020   Secondary hypertension 09/27/2020   T2DM (type 2 diabetes mellitus) ([place]) 07/17/2017   MRSA (methicillin resistant staph aureus) culture positive 07/14/2017    Past Surgical History:  Procedure Laterality Date   ANKLE SURGERY Right    PT reports pins and screws are present       Home Medications    Prior to Admission medications   Medication Sig Start Date End Date Taking? Authorizing Provider  amoxicillin-clavulanate (AUGMENTIN) 875-125 MG tablet Take 1 tablet by mouth every 12 (twelve) hours. 04/23/21  Yes Yailin Biederman, Leitha Schuller, NP  blood glucose meter kit and supplies KIT Dispense based on patient and insurance preference. Use up to four times daily as directed. (FOR ICD-9 250.00, 250.01). 06/12/20  Yes Vevelyn Francois, NP  Blood Glucose Monitoring Suppl (TRUE METRIX METER) w/Device KIT 1 kit by Does not apply route in the morning and at bedtime. 07/25/20 07/25/21 Yes King, Diona Foley, NP  glucose blood (RELION TRUE METRIX TEST STRIPS) test strip Use as instructed 07/25/20  Yes Vevelyn Francois, NP  meloxicam (MOBIC) 15 MG tablet Take 1 tablet (15 mg total) by mouth daily. 04/23/21  Yes Hans Eden, NP  metFORMIN (GLUCOPHAGE) 500 MG tablet Take 2 tablets (1,000 mg total)  by mouth 2 (two) times daily with a meal. 12/24/20 12/24/21 Yes King, Diona Foley, NP  rosuvastatin (CRESTOR) 20 MG tablet Take 1 tablet (20 mg total) by mouth daily. 07/25/20  Yes King, Diona Foley, NP  triamcinolone (KENALOG) 0.1 % Apply 1 application topically 2 (two) times daily. 12/24/20  Yes Vevelyn Francois, NP  lisinopril (ZESTRIL) 10 MG tablet Take 1 tablet (10 mg total) by mouth daily. Patient not taking: No sig reported 09/24/20   Vevelyn Francois, NP  trimethoprim-polymyxin b (POLYTRIM) ophthalmic solution Place 1 drop into the left eye every 6 (six) hours. 01/14/21   Volney American, PA-C    Family History Family History  Problem Relation Age of Onset   Healthy Mother    Heart attack Father     Social History Social History   Tobacco Use   Smoking status: Every Day    Types: Cigarettes   Smokeless tobacco: Never   Tobacco comments:    occasionally  Vaping Use   Vaping Use: Never used  Substance Use Topics   Alcohol use: Yes    Comment: QOD   Drug use: Yes    Types: Marijuana     Allergies   Patient has no known allergies.   Review of Systems Review of Systems Defer to HPI   Physical Exam Triage Vital Signs ED Triage Vitals  Enc Vitals Group     BP 04/23/21  1725 (!) 176/98     Pulse Rate 04/23/21 1725 97     Resp 04/23/21 1725 20     Temp 04/23/21 1725 98.8 F (37.1 C)     Temp src --      SpO2 04/23/21 1725 100 %     Weight --      Height --      Head Circumference --      Peak Flow --      Pain Score 04/23/21 1726 10     Pain Loc --      Pain Edu? --      Excl. in [place]? --    No data found.  Updated Vital Signs BP (!) 176/98   Pulse 97   Temp 98.8 F (37.1 C)   Resp 20   SpO2 100%   Visual Acuity Right Eye Distance:   Left Eye Distance:   Bilateral Distance:    Right Eye Near:   Left Eye Near:    Bilateral Near:     Physical Exam Constitutional:      Appearance: Normal appearance. He is obese.  HENT:     Head:  Normocephalic.     Mouth/Throat:      Comments: Giginval swelling at  left sided upper second premolar and first molar, with dental decay and small abscess forming  Broke tooth at left sided lower first molar with dental carie present  Eyes:     Extraocular Movements: Extraocular movements intact.  Pulmonary:     Effort: Pulmonary effort is normal.  Skin:    General: Skin is warm and dry.  Neurological:     Mental Status: He is alert and oriented to person, place, and time. Mental status is at baseline.  Psychiatric:        Mood and Affect: Mood normal.        Behavior: Behavior normal.     UC Treatments / Results  Labs (all labs ordered are listed, but only abnormal results are displayed) Labs Reviewed - No data to display  EKG   Radiology No results found.  Procedures Procedures (including critical care time)  Medications Ordered in UC Medications - No data to display  Initial Impression / Assessment and Plan / UC Course  I have reviewed the triage vital signs and the nursing notes.  Pertinent labs & imaging results that were available during my care of the patient were reviewed by me and considered in my medical decision making (see chart for details).  Dental abscess  Dental pain  Augmentin 875/125 bid for 7 days Meloxicam 15 mg daily, may use otc tylenol for additional relief Local dental resources given and advised follow up for evaluation  Final Clinical Impressions(s) / UC Diagnoses   Final diagnoses:  Dental abscess  Pain, dental     Discharge Instructions      Take antibiotic twice a day for 7 day  Can use meloxicam every morning with food for 5 days then as needed  Can use tylenol 650 mg every 6 hours as needed for comfort while using meloxciam  Dental resources are attached, please follow up with dentist as soon as possible for evaluation     ED Prescriptions     Medication Sig Dispense Auth. Provider   amoxicillin-clavulanate  (AUGMENTIN) 875-125 MG tablet Take 1 tablet by mouth every 12 (twelve) hours. 14 tablet Titiana Severa R, NP   meloxicam (MOBIC) 15 MG tablet Take 1 tablet (15 mg total) by mouth  daily. 30 tablet Hans Eden, NP      PDMP not reviewed this encounter.   Hans Eden, NP 04/23/21 1825

## 2021-08-03 ENCOUNTER — Ambulatory Visit (HOSPITAL_COMMUNITY)
Admission: EM | Admit: 2021-08-03 | Discharge: 2021-08-03 | Disposition: A | Discharge status: Home / Self Care | Payer: Self-pay | Attending: Emergency Medicine | Admitting: Emergency Medicine

## 2021-08-03 ENCOUNTER — Other Ambulatory Visit: Payer: Self-pay

## 2021-08-03 ENCOUNTER — Encounter (HOSPITAL_COMMUNITY): Payer: Self-pay | Admitting: Emergency Medicine

## 2021-08-03 DIAGNOSIS — M542 Cervicalgia: Secondary | ICD-10-CM

## 2021-08-03 MED ORDER — METHYLPREDNISOLONE SODIUM SUCC 125 MG IJ SOLR
60.0000 mg | Freq: Once | INTRAMUSCULAR | Status: AC
  Administered 2021-08-03: 60 mg via INTRAMUSCULAR

## 2021-08-03 MED ORDER — METHYLPREDNISOLONE SODIUM SUCC 125 MG IJ SOLR
INTRAMUSCULAR | Status: AC
  Filled 2021-08-03: qty 2

## 2021-08-03 MED ORDER — CYCLOBENZAPRINE HCL 10 MG PO TABS: 10.0000 mg | ORAL_TABLET | Freq: Every day | ORAL | 0 refills | Status: DC

## 2021-08-03 MED ORDER — NAPROXEN 500 MG PO TABS: 500.0000 mg | ORAL_TABLET | Freq: Two times a day (BID) | ORAL | 0 refills | Status: DC

## 2021-08-03 NOTE — ED Triage Notes (Signed)
Pt presents with upper back pain Xs 4 days. States started after lifting something heavy at work.

## 2021-08-03 NOTE — ED Provider Notes (Signed)
[place]    CSN: [id number] Arrival date & time: 08/03/21  0857      History   Chief Complaint Chief Complaint  Patient presents with   Back Pain    HPI Chris Gray is a 40 y.o. male.   Patient presents with right-sided upper back pain radiating into the right shoulder for 4 days.  Symptoms began after lifting a heavy object at work.  Pain is worsened with twisting, turning and bending but patient is able to do so.  Denies numbness or tingling, urinary or bowel changes, prior injury or trauma to back.  Attempted use of Aleve with minimal relief.  History of type 2 diabetes, tobacco use, hypertension .  Past Medical History:  Diagnosis Date   Diabetes mellitus without complication ([place])    Hypertension     Patient Active Problem List   Diagnosis Date Noted   Tobacco use disorder, continuous 09/27/2020   Secondary hypertension 09/27/2020   T2DM (type 2 diabetes mellitus) ([place]) 07/17/2017   MRSA (methicillin resistant staph aureus) culture positive 07/14/2017    Past Surgical History:  Procedure Laterality Date   ANKLE SURGERY Right    PT reports pins and screws are present       Home Medications    Prior to Admission medications   Medication Sig Start Date End Date Taking? Authorizing Provider  cyclobenzaprine (FLEXERIL) 10 MG tablet Take 1 tablet (10 mg total) by mouth at bedtime. 08/03/21  Yes Lupita Rosales R, NP  naproxen (NAPROSYN) 500 MG tablet Take 1 tablet (500 mg total) by mouth 2 (two) times daily. 08/03/21  Yes Riggin Cuttino, Leitha Schuller, NP  amoxicillin-clavulanate (AUGMENTIN) 875-125 MG tablet Take 1 tablet by mouth every 12 (twelve) hours. 04/23/21   Hans Eden, NP  blood glucose meter kit and supplies KIT Dispense based on patient and insurance preference. Use up to four times daily as directed. (FOR ICD-9 250.00, 250.01). 06/12/20   Vevelyn Francois, NP  glucose blood (RELION TRUE METRIX TEST STRIPS) test strip Use as instructed 07/25/20    Vevelyn Francois, NP  lisinopril (ZESTRIL) 10 MG tablet Take 1 tablet (10 mg total) by mouth daily. Patient not taking: No sig reported 09/24/20   Vevelyn Francois, NP  meloxicam (MOBIC) 15 MG tablet Take 1 tablet (15 mg total) by mouth daily. 04/23/21   Hans Eden, NP  metFORMIN (GLUCOPHAGE) 500 MG tablet Take 2 tablets (1,000 mg total) by mouth 2 (two) times daily with a meal. 12/24/20 12/24/21  Vevelyn Francois, NP  rosuvastatin (CRESTOR) 20 MG tablet Take 1 tablet (20 mg total) by mouth daily. 07/25/20   Vevelyn Francois, NP  triamcinolone (KENALOG) 0.1 % Apply 1 application topically 2 (two) times daily. 12/24/20   Vevelyn Francois, NP  trimethoprim-polymyxin b (POLYTRIM) ophthalmic solution Place 1 drop into the left eye every 6 (six) hours. 01/14/21   Volney American, PA-C    Family History Family History  Problem Relation Age of Onset   Healthy Mother    Heart attack Father     Social History Social History   Tobacco Use   Smoking status: Every Day    Types: Cigarettes   Smokeless tobacco: Never   Tobacco comments:    occasionally  Vaping Use   Vaping Use: Never used  Substance Use Topics   Alcohol use: Yes    Comment: QOD   Drug use: Yes    Types: Marijuana  Allergies   Patient has no known allergies.   Review of Systems Review of Systems  Constitutional: Negative.   Respiratory: Negative.    Cardiovascular: Negative.   Musculoskeletal:  Positive for back pain. Negative for arthralgias, gait problem, joint swelling, myalgias, neck pain and neck stiffness.  Skin: Negative.   Neurological: Negative.     Physical Exam Triage Vital Signs ED Triage Vitals  Enc Vitals Group     BP 08/03/21 0948 (!) 162/85     Pulse Rate 08/03/21 0948 91     Resp 08/03/21 0948 18     Temp 08/03/21 0948 98.2 F (36.8 C)     Temp Source 08/03/21 0948 Oral     SpO2 08/03/21 0948 97 %     Weight --      Height --      Head Circumference --      Peak Flow --       Pain Score 08/03/21 0947 10     Pain Loc --      Pain Edu? --      Excl. in [place]? --    No data found.  Updated Vital Signs BP (!) 162/85 (BP Location: Right Arm)   Pulse 91   Temp 98.2 F (36.8 C) (Oral)   Resp 18   SpO2 97%   Visual Acuity Right Eye Distance:   Left Eye Distance:   Bilateral Distance:    Right Eye Near:   Left Eye Near:    Bilateral Near:     Physical Exam Constitutional:      Appearance: Normal appearance. He is normal weight.  HENT:     Head: Normocephalic.  Eyes:     Extraocular Movements: Extraocular movements intact.  Neck:     Comments: Mid to right-sided tenderness, range of motion intact, no cervical adenopathy, no crepitus.  No edema noted Pulmonary:     Effort: Pulmonary effort is normal.  Skin:    General: Skin is warm and dry.  Neurological:     Mental Status: He is alert and oriented to person, place, and time. Mental status is at baseline.  Psychiatric:        Mood and Affect: Mood normal.        Behavior: Behavior normal.     UC Treatments / Results  Labs (all labs ordered are listed, but only abnormal results are displayed) Labs Reviewed - No data to display  EKG   Radiology No results found.  Procedures Procedures (including critical care time)  Medications Ordered in UC Medications  methylPREDNISolone sodium succinate (SOLU-MEDROL) 125 mg/2 mL injection 60 mg (has no administration in time range)    Initial Impression / Assessment and Plan / UC Course  I have reviewed the triage vital signs and the nursing notes.  Pertinent labs & imaging results that were available during my care of the patient were reviewed by me and considered in my medical decision making (see chart for details).  Acute neck pain  Methylprednisolone 60 mg IM now, will avoid steroid course due to history of type 2 diabetes, last A1c 7.7 Naproxen 500 mg twice daily for 7 days then as needed  Cyclobenzaprine 10 mg at bedtime as  needed Heating pad in 15-minute intervals, daily stretching, pillows for support Orthopedic walking referral given for persistent or reoccurring pain Final Clinical Impressions(s) / UC Diagnoses   Final diagnoses:  Acute neck pain     Discharge Instructions      Your pain  is most likely caused by irritation to the muscles or ligaments.   Take naproxen twice a day for 7 days then as needed  May use Flexeril as needed at bedtime for additional comfort, be mindful of this medication may make you drowsy, if this occurs you may take half a dose  You may use heating pad in 15 minute intervals as needed for additional comfort, within the first 2-3 days you may find comfort in using ice in 10-15 minutes over affected area  Begin stretching affected area daily for 10 minutes as tolerated to further loosen muscles   When lying down place pillow underneath and between knees for support  Can try sleeping without pillow on firm mattress   Practice good posture: head back, shoulders back, chest forward, pelvis back and weight distributed evenly on both legs  If pain persist after recommended treatment or reoccurs if may be beneficial to follow up with orthopedic specialist for evaluation, this doctor specializes in the bones and can manage your symptoms long-term with options such as but not limited to imaging, medications or physical therapy      ED Prescriptions     Medication Sig Dispense Auth. Provider   naproxen (NAPROSYN) 500 MG tablet Take 1 tablet (500 mg total) by mouth 2 (two) times daily. 30 tablet Seymore Brodowski R, NP   cyclobenzaprine (FLEXERIL) 10 MG tablet Take 1 tablet (10 mg total) by mouth at bedtime. 10 tablet Hans Eden, NP      PDMP not reviewed this encounter.   Hans Eden, NP 08/03/21 1025

## 2021-08-03 NOTE — Discharge Instructions (Signed)
Your pain is most likely caused by irritation to the muscles or ligaments.   Take naproxen twice a day for 7 days then as needed  May use Flexeril as needed at bedtime for additional comfort, be mindful of this medication may make you drowsy, if this occurs you may take half a dose  You may use heating pad in 15 minute intervals as needed for additional comfort, within the first 2-3 days you may find comfort in using ice in 10-15 minutes over affected area  Begin stretching affected area daily for 10 minutes as tolerated to further loosen muscles   When lying down place pillow underneath and between knees for support  Can try sleeping without pillow on firm mattress   Practice good posture: head back, shoulders back, chest forward, pelvis back and weight distributed evenly on both legs  If pain persist after recommended treatment or reoccurs if may be beneficial to follow up with orthopedic specialist for evaluation, this doctor specializes in the bones and can manage your symptoms long-term with options such as but not limited to imaging, medications or physical therapy

## 2022-06-25 ENCOUNTER — Telehealth: Payer: Self-pay

## 2022-06-25 ENCOUNTER — Other Ambulatory Visit: Payer: Self-pay | Admitting: Nurse Practitioner

## 2022-06-25 MED ORDER — METFORMIN HCL 500 MG PO TABS: 1000.0000 mg | ORAL_TABLET | Freq: Two times a day (BID) | ORAL | 3 refills | Status: DC

## 2022-06-25 NOTE — Telephone Encounter (Signed)
Metformin 

## 2022-06-27 ENCOUNTER — Ambulatory Visit (HOSPITAL_COMMUNITY)
Admission: EM | Admit: 2022-06-27 | Discharge: 2022-06-27 | Disposition: A | Discharge status: Home / Self Care | Payer: Self-pay | Attending: Emergency Medicine | Admitting: Emergency Medicine

## 2022-06-27 ENCOUNTER — Encounter (HOSPITAL_COMMUNITY): Payer: Self-pay | Admitting: *Deleted

## 2022-06-27 ENCOUNTER — Other Ambulatory Visit: Payer: Self-pay

## 2022-06-27 DIAGNOSIS — A084 Viral intestinal infection, unspecified: Secondary | ICD-10-CM

## 2022-06-27 MED ORDER — ONDANSETRON 4 MG PO TBDP
4.0000 mg | ORAL_TABLET | Freq: Three times a day (TID) | ORAL | 0 refills | Status: DC | PRN
Start: 2022-06-27 — End: 2022-11-27

## 2022-06-27 MED ORDER — LOPERAMIDE HCL 2 MG PO CAPS: 2.0000 mg | ORAL_CAPSULE | Freq: Four times a day (QID) | ORAL | 0 refills | Status: DC | PRN

## 2022-06-27 NOTE — Discharge Instructions (Signed)
Your symptoms are most likely caused by a virus, it will work its way out your system over the next few days  You can use zofran every 8 hours as needed for nausea, be mindful this medication may make you drowsy, take the first dose at home to see how it affects your body  You can use Imodium  to help with diarrhea, and be mindful over use of this medication may cause opposite effect constipation  You can use over-the-counter ibuprofen or Tylenol, which ever you have at home, to help manage fevers  Continue to promote hydration throughout the day by using electrolyte replacement solution such as Gatorade, body armor, Pedialyte, which ever you have at home  Try eating bland foods such as bread, rice, toast, fruit which are easier on the stomach to digest, avoid foods that are overly spicy, overly seasoned or greasy  

## 2022-06-27 NOTE — ED Provider Notes (Signed)
[place]    CSN: [id number] Arrival date & time: 06/27/22  1059      History   Chief Complaint Chief Complaint  Patient presents with   Emesis   Diarrhea    HPI Chris Gray is a 41 y.o. male.   Patient presents vomiting and diarrhea for 2 days. Last occurrence in office. Intermittent centralized abdominal pain worsened by vomiting. Possible sick contact in household.  Unable to tolerate food or liquids. Has not attempted treatment. Denies dietary changes or recent travel.    Past Medical History:  Diagnosis Date   Diabetes mellitus without complication ([place])    Hypertension     Patient Active Problem List   Diagnosis Date Noted   Tobacco use disorder, continuous 09/27/2020   Secondary hypertension 09/27/2020   T2DM (type 2 diabetes mellitus) ([place]) 07/17/2017   MRSA (methicillin resistant staph aureus) culture positive 07/14/2017    Past Surgical History:  Procedure Laterality Date   ANKLE SURGERY Right    PT reports pins and screws are present       Home Medications    Prior to Admission medications   Medication Sig Start Date End Date Taking? Authorizing Provider  amoxicillin-clavulanate (AUGMENTIN) 875-125 MG tablet Take 1 tablet by mouth every 12 (twelve) hours. 04/23/21   Hans Eden, NP  blood glucose meter kit and supplies KIT Dispense based on patient and insurance preference. Use up to four times daily as directed. (FOR ICD-9 250.00, 250.01). 06/12/20   Vevelyn Francois, NP  cyclobenzaprine (FLEXERIL) 10 MG tablet Take 1 tablet (10 mg total) by mouth at bedtime. 08/03/21   Hans Eden, NP  glucose blood (RELION TRUE METRIX TEST STRIPS) test strip Use as instructed 07/25/20   Vevelyn Francois, NP  lisinopril (ZESTRIL) 10 MG tablet Take 1 tablet (10 mg total) by mouth daily. Patient not taking: No sig reported 09/24/20   Vevelyn Francois, NP  meloxicam (MOBIC) 15 MG tablet Take 1 tablet (15 mg total) by mouth daily. 04/23/21    Hans Eden, NP  metFORMIN (GLUCOPHAGE) 500 MG tablet Take 2 tablets (1,000 mg total) by mouth 2 (two) times daily with a meal. 06/25/22 06/25/23  Fenton Foy, NP  naproxen (NAPROSYN) 500 MG tablet Take 1 tablet (500 mg total) by mouth 2 (two) times daily. 08/03/21   Ceaira Ernster, Leitha Schuller, NP  rosuvastatin (CRESTOR) 20 MG tablet Take 1 tablet (20 mg total) by mouth daily. 07/25/20   Vevelyn Francois, NP  triamcinolone (KENALOG) 0.1 % Apply 1 application topically 2 (two) times daily. 12/24/20   Vevelyn Francois, NP  trimethoprim-polymyxin b (POLYTRIM) ophthalmic solution Place 1 drop into the left eye every 6 (six) hours. 01/14/21   Volney American, PA-C    Family History Family History  Problem Relation Age of Onset   Healthy Mother    Heart attack Father     Social History Social History   Tobacco Use   Smoking status: Every Day    Types: Cigarettes   Smokeless tobacco: Never   Tobacco comments:    occasionally  Vaping Use   Vaping Use: Never used  Substance Use Topics   Alcohol use: Yes    Comment: QOD   Drug use: Yes    Types: Marijuana     Allergies   Patient has no known allergies.   Review of Systems Review of Systems  Constitutional: Negative.   Respiratory: Negative.    Gastrointestinal:  Positive for abdominal pain, diarrhea and vomiting. Negative for abdominal distention, anal bleeding, blood in stool, constipation, nausea and rectal pain.  Skin: Negative.   Neurological: Negative.      Physical Exam Triage Vital Signs ED Triage Vitals  Enc Vitals Group     BP 06/27/22 1154 (!) 147/103     Pulse Rate 06/27/22 1154 (!) 10     Resp 06/27/22 1154 20     Temp 06/27/22 1154 98 F (36.7 C)     Temp src --      SpO2 06/27/22 1154 98 %     Weight --      Height --      Head Circumference --      Peak Flow --      Pain Score 06/27/22 1149 0     Pain Loc --      Pain Edu? --      Excl. in [place]? --    No data found.  Updated Vital Signs BP  (!) 147/103   Pulse (!) 10   Temp 98 F (36.7 C)   Resp 20   SpO2 98%   Visual Acuity Right Eye Distance:   Left Eye Distance:   Bilateral Distance:    Right Eye Near:   Left Eye Near:    Bilateral Near:     Physical Exam Constitutional:      Appearance: Normal appearance.  HENT:     Head: Normocephalic.  Eyes:     Extraocular Movements: Extraocular movements intact.  Pulmonary:     Effort: Pulmonary effort is normal.  Abdominal:     General: Abdomen is flat. Bowel sounds are normal.     Palpations: Abdomen is soft.     Tenderness: There is abdominal tenderness in the epigastric area.  Skin:    General: Skin is warm and dry.  Neurological:     Mental Status: He is alert and oriented to person, place, and time. Mental status is at baseline.  Psychiatric:        Mood and Affect: Mood normal.        Behavior: Behavior normal.      UC Treatments / Results  Labs (all labs ordered are listed, but only abnormal results are displayed) Labs Reviewed - No data to display  EKG   Radiology No results found.  Procedures Procedures (including critical care time)  Medications Ordered in UC Medications - No data to display  Initial Impression / Assessment and Plan / UC Course  I have reviewed the triage vital signs and the nursing notes.  Pertinent labs & imaging results that were available during my care of the patient were reviewed by me and considered in my medical decision making (see chart for details).  Viral gastroenteritis   Etiology is most likely viral, discussed with patient, prescribe Zofran and DM for outpatient use, recommend increase fluid intake until symptoms resolved to maintain hydration, may eat food as tolerated but recommended a bland diet to prevent further stomach irritation, may use over-the-counter analgesics for management of stomach pain, may follow-up with urgent care as needed if symptoms persist or worsen, work note given Final Clinical  Impressions(s) / UC Diagnoses   Final diagnoses:  None   Discharge Instructions   None    ED Prescriptions   None    PDMP not reviewed this encounter.   Hans Eden, NP 06/27/22 1233

## 2022-06-27 NOTE — ED Triage Notes (Signed)
T reports vomiting and diarrhea started SAt. Morning.

## 2022-07-09 ENCOUNTER — Other Ambulatory Visit: Payer: Self-pay | Admitting: Nurse Practitioner

## 2022-07-23 ENCOUNTER — Ambulatory Visit: Payer: Self-pay | Admitting: Nurse Practitioner

## 2022-10-12 ENCOUNTER — Encounter (HOSPITAL_COMMUNITY): Payer: Self-pay | Admitting: *Deleted

## 2022-10-12 ENCOUNTER — Other Ambulatory Visit: Payer: Self-pay

## 2022-10-12 ENCOUNTER — Ambulatory Visit (HOSPITAL_COMMUNITY)
Admission: EM | Admit: 2022-10-12 | Discharge: 2022-10-12 | Disposition: A | Discharge status: Home / Self Care | Payer: Self-pay | Attending: Emergency Medicine | Admitting: Emergency Medicine

## 2022-10-12 DIAGNOSIS — S39012A Strain of muscle, fascia and tendon of lower back, initial encounter: Secondary | ICD-10-CM

## 2022-10-12 DIAGNOSIS — L089 Local infection of the skin and subcutaneous tissue, unspecified: Secondary | ICD-10-CM

## 2022-10-12 MED ORDER — DOXYCYCLINE HYCLATE 100 MG PO CAPS: 100.0000 mg | ORAL_CAPSULE | Freq: Two times a day (BID) | ORAL | 0 refills | Status: DC

## 2022-10-12 MED ORDER — NAPROXEN 500 MG PO TABS: 500.0000 mg | ORAL_TABLET | Freq: Two times a day (BID) | ORAL | 0 refills | Status: DC

## 2022-10-12 MED ORDER — CYCLOBENZAPRINE HCL 10 MG PO TABS: 10.0000 mg | ORAL_TABLET | Freq: Two times a day (BID) | ORAL | 0 refills | Status: DC | PRN

## 2022-10-12 NOTE — ED Provider Notes (Signed)
[place]    CSN: [id number] Arrival date & time: 10/12/22  1135      History   Chief Complaint Chief Complaint  Patient presents with   toe problems   Back Pain    HPI Chris Gray is a 42 y.o. male.  Patient presents complaining of left-sided lower back pain that started 2 days ago.  He reports that onset of symptoms began after he lifted a heavy item.  He feels like the pain reoccurs with bending and certain movement of his torso.  He denies any fall or trauma to his back.  He denies any numbness or tingling in his extremities.  He denies any urinary symptoms, fever, or chills. He has taken Tylenol with minimal relief of symptoms.   Patient presents due to sores on both greater toes bilaterally for the past 2 months.  Patient denies any initial trauma or injury to the toe.  He denies any prior history of ingrown toenail. He denies any history of neuropathy in his feet. He has a history of type 2 diabetes mellitus. He takes his medications for DM, he states that he doesn't check his blood sugar at home. He has not had time to follow up with his PCP recently.  History of MRSA.    Back Pain   Past Medical History:  Diagnosis Date   Diabetes mellitus without complication ([place])    Hypertension     Patient Active Problem List   Diagnosis Date Noted   Tobacco use disorder, continuous 09/27/2020   Secondary hypertension 09/27/2020   T2DM (type 2 diabetes mellitus) ([place]) 07/17/2017   MRSA (methicillin resistant staph aureus) culture positive 07/14/2017    Past Surgical History:  Procedure Laterality Date   ANKLE SURGERY Right    PT reports pins and screws are present       Home Medications    Prior to Admission medications   Medication Sig Start Date End Date Taking? Authorizing Provider  cyclobenzaprine (FLEXERIL) 10 MG tablet Take 1 tablet (10 mg total) by mouth 2 (two) times daily as needed for muscle spasms. 10/12/22  Yes Flossie Dibble, NP   doxycycline (VIBRAMYCIN) 100 MG capsule Take 1 capsule (100 mg total) by mouth 2 (two) times daily. 10/12/22  Yes Flossie Dibble, NP  naproxen (NAPROSYN) 500 MG tablet Take 1 tablet (500 mg total) by mouth 2 (two) times daily. 10/12/22  Yes Flossie Dibble, NP  blood glucose meter kit and supplies KIT Dispense based on patient and insurance preference. Use up to four times daily as directed. (FOR ICD-9 250.00, 250.01). 06/12/20   Vevelyn Francois, NP  glucose blood (RELION TRUE METRIX TEST STRIPS) test strip Use as instructed 07/25/20   Vevelyn Francois, NP  loperamide (IMODIUM) 2 MG capsule Take 1 capsule (2 mg total) by mouth 4 (four) times daily as needed for diarrhea or loose stools. 06/27/22   Hans Eden, NP  metFORMIN (GLUCOPHAGE) 500 MG tablet TAKE 2 TABLETS BY MOUTH TWICE DAILY WITH A MEAL 07/09/22   Fenton Foy, NP  ondansetron (ZOFRAN-ODT) 4 MG disintegrating tablet Take 1 tablet (4 mg total) by mouth every 8 (eight) hours as needed for nausea or vomiting. 06/27/22   Hans Eden, NP  rosuvastatin (CRESTOR) 20 MG tablet Take 1 tablet (20 mg total) by mouth daily. 07/25/20   Vevelyn Francois, NP  triamcinolone (KENALOG) 0.1 % Apply 1 application topically 2 (two) times daily. 12/24/20   Edison Pace,  Diona Foley, NP  trimethoprim-polymyxin b (POLYTRIM) ophthalmic solution Place 1 drop into the left eye every 6 (six) hours. 01/14/21   Volney American, PA-C    Family History Family History  Problem Relation Age of Onset   Healthy Mother    Heart attack Father     Social History Social History   Tobacco Use   Smoking status: Every Day    Types: Cigarettes   Smokeless tobacco: Never   Tobacco comments:    occasionally  Vaping Use   Vaping Use: Never used  Substance Use Topics   Alcohol use: Yes    Comment: QOD   Drug use: Yes    Types: Marijuana     Allergies   Patient has no known allergies.   Review of Systems Review of Systems  Musculoskeletal:  Positive  for back pain.   Per HPI  Physical Exam Triage Vital Signs ED Triage Vitals  Enc Vitals Group     BP 10/12/22 1439 (!) 165/103     Pulse Rate 10/12/22 1439 86     Resp 10/12/22 1439 18     Temp 10/12/22 1439 98 F (36.7 C)     Temp src --      SpO2 10/12/22 1439 96 %     Weight --      Height --      Head Circumference --      Peak Flow --      Pain Score 10/12/22 1438 6     Pain Loc --      Pain Edu? --      Excl. in [place]? --    No data found.  Updated Vital Signs BP (!) 165/103   Pulse 86   Temp 98 F (36.7 C)   Resp 18   SpO2 96%   Physical Exam Vitals and nursing note reviewed.  Musculoskeletal:     Thoracic back: Normal.     Lumbar back: Spasms and tenderness (RT sided) present. No swelling, edema, deformity, signs of trauma, lacerations or bony tenderness. Decreased range of motion. Negative right straight leg raise test and negative left straight leg raise test. No scoliosis.     Comments: Reproducible right sided lumbar pain with movement of torso and bending.   Skin:    Findings: Erythema present.          Comments: RT and LFT greater toe: Hyperkeratotic skin around toe nail, erythema present around proximal nail fold, tenderness upon palpation, darkened skin present around medial distal skin of greater toes. Thickened toe nails bilaterally.  No drainage noted.       UC Treatments / Results  Labs (all labs ordered are listed, but only abnormal results are displayed) Labs Reviewed - No data to display  EKG   Radiology No results found.  Procedures Procedures (including critical care time)  Medications Ordered in UC Medications - No data to display  Initial Impression / Assessment and Plan / UC Course  I have reviewed the triage vital signs and the nursing notes.  Pertinent labs & imaging results that were available during my care of the patient were reviewed by me and considered in my medical decision making (see chart for details).      Patient was evaluated for strain of lumbar region.  Naproxen and Flexeril was prescribed.  Patient was made aware of regiment.  Patient was made aware of safety precautions with muscle relaxant.  Patient was evaluated for toe infection.  Doxycycline prescribed  to provide antimicrobial coverage based on symptomology and physical assessment.  Patient was made aware of medication regiment.  Due to history of diabetes mellitus, patient was made aware that he will need to follow-up with a podiatrist.  Patient was made aware of timeline for resolution of symptoms.  Patient verbalized understanding of instructions.   Charting was provided using a a verbal dictation system, charting was proofread for errors, errors may occur which could change the meaning of the information charted.   Final Clinical Impressions(s) / UC Diagnoses   Final diagnoses:  Strain of lumbar region, initial encounter  Toe infection     Discharge Instructions      Muscle strain: Naproxen has been sent to the pharmacy, you can take this 2 times daily with 12 hours in between each dose, please make sure you put food on your stomach when you take this medicine.  Flexeril has been sent to the pharmacy, this is a muscle relaxant, you can take this 2 times daily, this medication may make you sleepy, please do not operate any heavy machinery or drive a car after taking this medicine.  Toe infection:  Doxycycline is being sent to the pharmacy, you will take this medication 2 times daily for the next 7 days.  Please make sure to take this medication with a full glass of water.    I have attached information for podiatry, please follow-up with this office.      ED Prescriptions     Medication Sig Dispense Auth. Provider   naproxen (NAPROSYN) 500 MG tablet Take 1 tablet (500 mg total) by mouth 2 (two) times daily. 30 tablet Flossie Dibble, NP   cyclobenzaprine (FLEXERIL) 10 MG tablet Take 1 tablet (10 mg total) by mouth 2  (two) times daily as needed for muscle spasms. 20 tablet Flossie Dibble, NP   doxycycline (VIBRAMYCIN) 100 MG capsule Take 1 capsule (100 mg total) by mouth 2 (two) times daily. 14 capsule Flossie Dibble, NP      PDMP not reviewed this encounter.   Flossie Dibble, NP 10/12/22 1550

## 2022-10-12 NOTE — ED Triage Notes (Signed)
Pt reports Back pain that started 10-10-22 and Pt also has sores on end of each great toe.

## 2022-10-12 NOTE — Discharge Instructions (Signed)
Muscle strain: Naproxen has been sent to the pharmacy, you can take this 2 times daily with 12 hours in between each dose, please make sure you put food on your stomach when you take this medicine.  Flexeril has been sent to the pharmacy, this is a muscle relaxant, you can take this 2 times daily, this medication may make you sleepy, please do not operate any heavy machinery or drive a car after taking this medicine.  Toe infection:  Doxycycline is being sent to the pharmacy, you will take this medication 2 times daily for the next 7 days.  Please make sure to take this medication with a full glass of water.    I have attached information for podiatry, please follow-up with this office.

## 2022-11-27 ENCOUNTER — Other Ambulatory Visit: Payer: Self-pay

## 2022-11-27 ENCOUNTER — Encounter (HOSPITAL_COMMUNITY): Payer: Self-pay | Admitting: *Deleted

## 2022-11-27 ENCOUNTER — Ambulatory Visit (HOSPITAL_COMMUNITY)
Admission: EM | Admit: 2022-11-27 | Discharge: 2022-11-27 | Disposition: A | Discharge status: Home / Self Care | Payer: Self-pay | Attending: Emergency Medicine | Admitting: Emergency Medicine

## 2022-11-27 DIAGNOSIS — R030 Elevated blood-pressure reading, without diagnosis of hypertension: Secondary | ICD-10-CM

## 2022-11-27 DIAGNOSIS — A084 Viral intestinal infection, unspecified: Secondary | ICD-10-CM

## 2022-11-27 LAB — CBG MONITORING, ED: Glucose-Capillary: 240 mg/dL — ABNORMAL HIGH (ref 70–99)

## 2022-11-27 MED ORDER — LOPERAMIDE HCL 2 MG PO CAPS: 2.0000 mg | ORAL_CAPSULE | Freq: Four times a day (QID) | ORAL | 0 refills | Status: DC | PRN

## 2022-11-27 MED ORDER — ONDANSETRON 8 MG PO TBDP: 8.0000 mg | ORAL_TABLET | Freq: Three times a day (TID) | ORAL | 0 refills | Status: DC | PRN

## 2022-11-27 NOTE — ED Triage Notes (Signed)
Pt has had vomiting and diarrhea for a couple of days. Pt needs a work note.

## 2022-11-27 NOTE — ED Provider Notes (Addendum)
[place]    CSN: [id number] Arrival date & time: 11/27/22  1233      History   Chief Complaint Chief Complaint  Patient presents with   Emesis   Diarrhea    HPI Chris Gray is a 42 y.o. male.   Reports 2 days of diarrhea and vomiting. Last emesis this AM, was yellow. Denies blood in stool. Denies fevers, reports cough, denies CP or SOB. Kids were sick recently, same symptoms.  Patient is diabetic and has had no recent primary care evaluation.  The history is provided by the patient.  Emesis Associated symptoms: diarrhea   Associated symptoms: no abdominal pain, no arthralgias, no chills, no cough, no fever, no headaches and no sore throat   Diarrhea Associated symptoms: vomiting   Associated symptoms: no abdominal pain, no arthralgias, no chills, no fever and no headaches     Past Medical History:  Diagnosis Date   Diabetes mellitus without complication ([place])    Hypertension     Patient Active Problem List   Diagnosis Date Noted   Tobacco use disorder, continuous 09/27/2020   Secondary hypertension 09/27/2020   T2DM (type 2 diabetes mellitus) ([place]) 07/17/2017   MRSA (methicillin resistant staph aureus) culture positive 07/14/2017    Past Surgical History:  Procedure Laterality Date   ANKLE SURGERY Right    PT reports pins and screws are present       Home Medications    Prior to Admission medications   Medication Sig Start Date End Date Taking? Authorizing Provider  loperamide (IMODIUM) 2 MG capsule Take 1 capsule (2 mg total) by mouth 4 (four) times daily as needed for diarrhea or loose stools. 11/27/22  Yes Louretta Shorten, [place], FNP  ondansetron (ZOFRAN-ODT) 8 MG disintegrating tablet Take 1 tablet (8 mg total) by mouth every 8 (eight) hours as needed for nausea or vomiting. 11/27/22  Yes Louretta Shorten, [place], FNP  blood glucose meter kit and supplies KIT Dispense based on patient and insurance preference. Use up to four times daily as  directed. (FOR ICD-9 250.00, 250.01). 06/12/20   Vevelyn Francois, NP  cyclobenzaprine (FLEXERIL) 10 MG tablet Take 1 tablet (10 mg total) by mouth 2 (two) times daily as needed for muscle spasms. 10/12/22   Flossie Dibble, NP  glucose blood (RELION TRUE METRIX TEST STRIPS) test strip Use as instructed 07/25/20   Vevelyn Francois, NP  metFORMIN (GLUCOPHAGE) 500 MG tablet TAKE 2 TABLETS BY MOUTH TWICE DAILY WITH A MEAL 07/09/22   Fenton Foy, NP  rosuvastatin (CRESTOR) 20 MG tablet Take 1 tablet (20 mg total) by mouth daily. 07/25/20   Vevelyn Francois, NP  triamcinolone (KENALOG) 0.1 % Apply 1 application topically 2 (two) times daily. 12/24/20   Vevelyn Francois, NP  trimethoprim-polymyxin b (POLYTRIM) ophthalmic solution Place 1 drop into the left eye every 6 (six) hours. 01/14/21   Volney American, PA-C    Family History Family History  Problem Relation Age of Onset   Healthy Mother    Heart attack Father     Social History Social History   Tobacco Use   Smoking status: Every Day    Types: Cigarettes   Smokeless tobacco: Never   Tobacco comments:    occasionally  Vaping Use   Vaping Use: Never used  Substance Use Topics   Alcohol use: Yes    Comment: QOD   Drug use: Yes    Types: Marijuana  Allergies   Patient has no known allergies.   Review of Systems Review of Systems  Constitutional:  Negative for chills, fatigue and fever.  HENT:  Negative for sore throat.   Respiratory:  Negative for cough and shortness of breath.   Cardiovascular:  Negative for chest pain.  Gastrointestinal:  Positive for diarrhea, nausea and vomiting. Negative for abdominal pain.  Genitourinary:  Negative for dysuria.  Musculoskeletal:  Negative for arthralgias and back pain.  Neurological:  Negative for syncope, light-headedness and headaches.     Physical Exam Triage Vital Signs ED Triage Vitals  Enc Vitals Group     BP 11/27/22 1411 (!) 173/93     Pulse Rate 11/27/22 1411  99     Resp 11/27/22 1411 18     Temp 11/27/22 1411 98.2 F (36.8 C)     Temp src --      SpO2 11/27/22 1411 96 %     Weight --      Height --      Head Circumference --      Peak Flow --      Pain Score 11/27/22 1409 5     Pain Loc --      Pain Edu? --      Excl. in [place]? --    No data found.  Updated Vital Signs BP (!) 173/93   Pulse 99   Temp 98.2 F (36.8 C)   Resp 18   SpO2 96%   Visual Acuity Right Eye Distance:   Left Eye Distance:   Bilateral Distance:    Right Eye Near:   Left Eye Near:    Bilateral Near:     Physical Exam Vitals and nursing note reviewed.  Constitutional:      Appearance: Normal appearance.     Comments: Pleasant 42 year old male who appears stated age.  HENT:     Head: Normocephalic and atraumatic.     Right Ear: External ear normal.     Left Ear: External ear normal.     Mouth/Throat:     Mouth: Mucous membranes are moist.  Cardiovascular:     Rate and Rhythm: Normal rate and regular rhythm.     Pulses: Normal pulses.     Heart sounds: Normal heart sounds, S1 normal and S2 normal.  Pulmonary:     Effort: Pulmonary effort is normal. No respiratory distress.     Breath sounds: Normal breath sounds.  Chest:     Comments: Lungs vesicular bilaterally. Abdominal:     General: Abdomen is flat. Bowel sounds are normal. There is no distension.     Palpations: Abdomen is soft. There is no mass.     Tenderness: There is no abdominal tenderness. There is no guarding or rebound.     Hernia: No hernia is present.  Musculoskeletal:        General: Normal range of motion.  Skin:    General: Skin is warm and dry.     Capillary Refill: Capillary refill takes less than 2 seconds.  Neurological:     Mental Status: He is alert.  Psychiatric:        Behavior: Behavior is cooperative.      UC Treatments / Results  Labs (all labs ordered are listed, but only abnormal results are displayed) Labs Reviewed  CBG MONITORING, ED - Abnormal;  Notable for the following components:      Result Value   Glucose-Capillary 240 (*)    All other components within  normal limits    EKG   Radiology No results found.  Procedures Procedures (including critical care time)  Medications Ordered in UC Medications - No data to display  Initial Impression / Assessment and Plan / UC Course  I have reviewed the triage vital signs and the nursing notes.  Pertinent labs & imaging results that were available during my care of the patient were reviewed by me and considered in my medical decision making (see chart for details).  Vital signs and nursing note reviewed, patient is hemodynamically stable.  Suspect mild dehydration due to emesis and diarrhea, bland diet and rehydration discussed.  Blood glucose 240 on spot check.  Discussed importance of obtaining a primary care provider.  Low concern for DKA given stable vitals and GCS of 15. Given ODT Zofran 8 mg to use as needed and Imodium for diarrhea. Return precautions discussed, patient verbalized understanding.    Initially patient appeared hypertensive at 173/93.  Blood pressure on recheck was 140s over 90s.  Discussed importance of following up and establishing a primary care provider.   Final Clinical Impressions(s) / UC Diagnoses   Final diagnoses:  Viral gastroenteritis  Elevated blood pressure reading     Discharge Instructions      It appears that you have viral gastroenteritis.  Please take the orally disintegrating tablets Zofran as needed every 8 hours for nausea or vomiting.  You can also take the Imodium 2 mg capsules up to 4 times daily as needed for diarrhea.  Please follow a bland diet with broths and soups, you can gradually increase to solids with toast, applesauce, rice and grilled or boiled chicken.  Please return to clinic or seek immediate care if you are not able to hold down food or fluids, develop loss of consciousness, fever that does not resolve with medication,  or worsening of symptoms.     ED Prescriptions     Medication Sig Dispense Auth. Provider   ondansetron (ZOFRAN-ODT) 8 MG disintegrating tablet Take 1 tablet (8 mg total) by mouth every 8 (eight) hours as needed for nausea or vomiting. 20 tablet Louretta Shorten, [place], [place]   loperamide (IMODIUM) 2 MG capsule Take 1 capsule (2 mg total) by mouth 4 (four) times daily as needed for diarrhea or loose stools. 12 capsule Johnpaul Gillentine, [place], [place]      I have reviewed the PDMP during this encounter.   Adely Facer, [place], [place] 11/27/22 [place], [place],  11/27/22 [phone]

## 2022-11-27 NOTE — Discharge Instructions (Signed)
It appears that you have viral gastroenteritis.  Please take the orally disintegrating tablets Zofran as needed every 8 hours for nausea or vomiting.  You can also take the Imodium 2 mg capsules up to 4 times daily as needed for diarrhea.  Please follow a bland diet with broths and soups, you can gradually increase to solids with toast, applesauce, rice and grilled or boiled chicken.  Please return to clinic or seek immediate care if you are not able to hold down food or fluids, develop loss of consciousness, fever that does not resolve with medication, or worsening of symptoms.

## 2022-12-02 ENCOUNTER — Encounter (HOSPITAL_COMMUNITY): Payer: Self-pay

## 2023-01-06 ENCOUNTER — Encounter (HOSPITAL_COMMUNITY): Payer: Self-pay

## 2023-01-06 ENCOUNTER — Ambulatory Visit (HOSPITAL_COMMUNITY)
Admission: EM | Admit: 2023-01-06 | Discharge: 2023-01-06 | Disposition: A | Discharge status: Home / Self Care | Payer: Self-pay | Attending: Emergency Medicine | Admitting: Emergency Medicine

## 2023-01-06 DIAGNOSIS — M6289 Other specified disorders of muscle: Secondary | ICD-10-CM

## 2023-01-06 DIAGNOSIS — K0889 Other specified disorders of teeth and supporting structures: Secondary | ICD-10-CM

## 2023-01-06 DIAGNOSIS — M62838 Other muscle spasm: Secondary | ICD-10-CM

## 2023-01-06 MED ORDER — CYCLOBENZAPRINE HCL 10 MG PO TABS
10.0000 mg | ORAL_TABLET | Freq: Every evening | ORAL | 0 refills | Status: DC
Start: 2023-01-06 — End: 2023-06-03

## 2023-01-06 MED ORDER — AMOXICILLIN-POT CLAVULANATE 875-125 MG PO TABS: 1.0000 | ORAL_TABLET | Freq: Two times a day (BID) | ORAL | 0 refills | Status: AC

## 2023-01-06 MED ORDER — KETOROLAC TROMETHAMINE 30 MG/ML IJ SOLN
30.0000 mg | Freq: Once | INTRAMUSCULAR | Status: AC
  Administered 2023-01-06: 30 mg via INTRAMUSCULAR

## 2023-01-06 MED ORDER — KETOROLAC TROMETHAMINE 30 MG/ML IJ SOLN
INTRAMUSCULAR | Status: AC
  Filled 2023-01-06: qty 1

## 2023-01-06 MED ORDER — IBUPROFEN 800 MG PO TABS: 800.0000 mg | ORAL_TABLET | Freq: Three times a day (TID) | ORAL | 0 refills | Status: DC

## 2023-01-06 NOTE — Discharge Instructions (Addendum)
We have given you an injection of pain medicine today.  You can use ibuprofen and/or tylenol at home for pain. Do not take any ibuprofen for the rest of today.  Muscle relaxer at bedtime.  Please take augmentin as prescribed. Take with food to avoid upset stomach. Drink lots of water! Please call dentist for follow up.  Please go to the emergency department if symptoms become severe.

## 2023-01-06 NOTE — ED Provider Notes (Signed)
[place]    CSN: [id number] Arrival date & time: 01/06/23  1347     History   Chief Complaint Chief Complaint  Patient presents with   Dental Pain   Back Pain    HPI Chris Gray is a 42 y.o. male.  Here with a few day history of back pain.  Feels it has been worsening over the last 2 days.  Feels tight and like a spasm.  Mid and lower back. Does not radiate. Worse with movement.  He does a lot of heavy lifting at work and feels he pulled a muscle.  He has tried Tylenol and a Percocet without relief. Denies bowel or bladder dysfunction.  No weakness, numbness or tingling of the extremities. Denies blunt trauma or injury  Also reporting 2-day history of left lower dental pain. He has not seen a dentist in a while Denies fever.   Past Medical History:  Diagnosis Date   Diabetes mellitus without complication ([place])    Hypertension     Patient Active Problem List   Diagnosis Date Noted   Tobacco use disorder, continuous 09/27/2020   Secondary hypertension 09/27/2020   T2DM (type 2 diabetes mellitus) ([place]) 07/17/2017   MRSA (methicillin resistant staph aureus) culture positive 07/14/2017    Past Surgical History:  Procedure Laterality Date   ANKLE SURGERY Right    PT reports pins and screws are present    Home Medications    Prior to Admission medications   Medication Sig Start Date End Date Taking? Authorizing Provider  amoxicillin-clavulanate (AUGMENTIN) 875-125 MG tablet Take 1 tablet by mouth 2 (two) times daily for 5 days. 01/06/23 01/11/23 Yes Konner Saiz, [place] Guiles, PA-C  cyclobenzaprine (FLEXERIL) 10 MG tablet Take 1 tablet (10 mg total) by mouth at bedtime. 01/06/23  Yes Toinette Lackie, [place] Guiles, PA-C  ibuprofen (ADVIL) 800 MG tablet Take 1 tablet (800 mg total) by mouth 3 (three) times daily. 01/06/23  Yes Patryce Depriest, PA-C  blood glucose meter kit and supplies KIT Dispense based on patient and insurance preference. Use up to four times daily as directed. (FOR  ICD-9 250.00, 250.01). 06/12/20   Vevelyn Francois, NP  glucose blood (RELION TRUE METRIX TEST STRIPS) test strip Use as instructed 07/25/20   Vevelyn Francois, NP  loperamide (IMODIUM) 2 MG capsule Take 1 capsule (2 mg total) by mouth 4 (four) times daily as needed for diarrhea or loose stools. 11/27/22   [place], [place], FNP  metFORMIN (GLUCOPHAGE) 500 MG tablet TAKE 2 TABLETS BY MOUTH TWICE DAILY WITH A MEAL 07/09/22   Fenton Foy, NP  ondansetron (ZOFRAN-ODT) 8 MG disintegrating tablet Take 1 tablet (8 mg total) by mouth every 8 (eight) hours as needed for nausea or vomiting. 11/27/22   [place], [place], FNP  rosuvastatin (CRESTOR) 20 MG tablet Take 1 tablet (20 mg total) by mouth daily. 07/25/20   Vevelyn Francois, NP  triamcinolone (KENALOG) 0.1 % Apply 1 application topically 2 (two) times daily. 12/24/20   Vevelyn Francois, NP  trimethoprim-polymyxin b (POLYTRIM) ophthalmic solution Place 1 drop into the left eye every 6 (six) hours. 01/14/21   Volney American, PA-C    Family History Family History  Problem Relation Age of Onset   Healthy Mother    Heart attack Father     Social History Social History   Tobacco Use   Smoking status: Every Day    Types: Cigarettes   Smokeless tobacco: Never   Tobacco comments:  occasionally  Vaping Use   Vaping Use: Never used  Substance Use Topics   Alcohol use: Yes    Comment: QOD   Drug use: Yes    Types: Marijuana     Allergies   Patient has no known allergies.   Review of Systems Review of Systems As per HPI  Physical Exam Triage Vital Signs ED Triage Vitals  Enc Vitals Group     BP 01/06/23 1507 129/81     Pulse Rate 01/06/23 1507 97     Resp 01/06/23 1507 18     Temp 01/06/23 1507 98.1 F (36.7 C)     Temp Source 01/06/23 1507 Oral     SpO2 01/06/23 1507 97 %     Weight --      Height --      Head Circumference --      Peak Flow --      Pain Score 01/06/23 1509 10     Pain Loc --      Pain Edu? --       Excl. in [place]? --    No data found.  Updated Vital Signs BP 129/81 (BP Location: Right Arm)   Pulse 97   Temp 98.1 F (36.7 C) (Oral)   Resp 18   SpO2 97%    Physical Exam Vitals and nursing note reviewed.  Constitutional:      General: He is not in acute distress. HENT:     Mouth/Throat:     Mouth: Mucous membranes are moist.     Dentition: Dental tenderness and gingival swelling present.     Pharynx: Oropharynx is clear.   Eyes:     Extraocular Movements: Extraocular movements intact.     Conjunctiva/sclera: Conjunctivae normal.     Pupils: Pupils are equal, round, and reactive to light.  Cardiovascular:     Rate and Rhythm: Normal rate and regular rhythm.     Heart sounds: Normal heart sounds.  Pulmonary:     Effort: Pulmonary effort is normal.     Breath sounds: Normal breath sounds.  Abdominal:     Palpations: Abdomen is soft.     Tenderness: There is no abdominal tenderness.  Musculoskeletal:        General: Normal range of motion.     Cervical back: Normal range of motion. No rigidity or tenderness.     Comments: No bony tenderness. T-L paraspinals feel tight. No bruising.   Neurological:     Mental Status: He is alert and oriented to person, place, and time.     Cranial Nerves: Cranial nerves 2-12 are intact.     Sensory: Sensation is intact.     Motor: Motor function is intact. No weakness or pronator drift.     Coordination: Coordination is intact. Romberg sign negative.     Gait: Gait is intact.     Deep Tendon Reflexes: Reflexes are normal and symmetric.     Comments: Strength 5/5 all extremities. Sensation and pulses intact.      UC Treatments / Results  Labs (all labs ordered are listed, but only abnormal results are displayed) Labs Reviewed - No data to display  EKG  Radiology No results found.  Procedures Procedures   Medications Ordered in UC Medications  ketorolac (TORADOL) 30 MG/ML injection 30 mg (30 mg Intramuscular Given  01/06/23 1559)    Initial Impression / Assessment and Plan / UC Course  I have reviewed the triage vital signs and the nursing notes.  Pertinent labs & imaging results that were available during my care of the patient were reviewed by me and considered in my medical decision making (see chart for details).  No red flags on exam. Neurologically intact. No indication for xray imaging, likely muscular. Toradol IM given in clinic Flexeril at night  Dental pain, possible infection Cover with augmentin BID x 5 days Provided list of dental resources for follow up  Final Clinical Impressions(s) / UC Diagnoses   Final diagnoses:  Muscle tightness  Muscle spasm  Pain, dental     Discharge Instructions      We have given you an injection of pain medicine today.  You can use ibuprofen and/or tylenol at home for pain. Do not take any ibuprofen for the rest of today.  Muscle relaxer at bedtime.  Please take augmentin as prescribed. Take with food to avoid upset stomach. Drink lots of water! Please call dentist for follow up.  Please go to the emergency department if symptoms become severe.     ED Prescriptions     Medication Sig Dispense Auth. Provider   cyclobenzaprine (FLEXERIL) 10 MG tablet Take 1 tablet (10 mg total) by mouth at bedtime. 10 tablet Allanna Bresee, PA-C   ibuprofen (ADVIL) 800 MG tablet Take 1 tablet (800 mg total) by mouth 3 (three) times daily. 21 tablet Buena Boehm, PA-C   amoxicillin-clavulanate (AUGMENTIN) 875-125 MG tablet Take 1 tablet by mouth 2 (two) times daily for 5 days. 10 tablet Melenie Minniear, [place] Guiles, PA-C      PDMP not reviewed this encounter.   [place], [place] 01/06/23 [id number]

## 2023-01-06 NOTE — ED Triage Notes (Signed)
Patient c/o left lower dental pain x 2 days and upper, middle, and lower back pain x 1 week. Patient states he lifts objects at work.  Patient states he has been taking Tylenol and a friend gave him a Percocet , but did not help. Patient states he took the Tylenol lst night.

## 2023-06-03 ENCOUNTER — Ambulatory Visit (HOSPITAL_COMMUNITY)
Admission: EM | Admit: 2023-06-03 | Discharge: 2023-06-03 | Disposition: A | Discharge status: Home / Self Care | Payer: 59 | Attending: Physician Assistant | Admitting: Physician Assistant

## 2023-06-03 ENCOUNTER — Encounter (HOSPITAL_COMMUNITY): Payer: Self-pay

## 2023-06-03 DIAGNOSIS — L509 Urticaria, unspecified: Secondary | ICD-10-CM | POA: Diagnosis not present

## 2023-06-03 DIAGNOSIS — E1165 Type 2 diabetes mellitus with hyperglycemia: Secondary | ICD-10-CM | POA: Diagnosis not present

## 2023-06-03 DIAGNOSIS — L282 Other prurigo: Secondary | ICD-10-CM

## 2023-06-03 LAB — POCT FASTING CBG KUC MANUAL ENTRY: POCT Glucose (KUC): 295 mg/dL — AB (ref 70–99)

## 2023-06-03 LAB — POCT URINALYSIS DIP (MANUAL ENTRY)
Bilirubin, UA: NEGATIVE
Blood, UA: NEGATIVE
Glucose, UA: >=1000 mg/dL — AB
Ketones, POC UA: NEGATIVE mg/dL
Leukocytes, UA: NEGATIVE
Nitrite, UA: NEGATIVE
Protein Ur, POC: NEGATIVE mg/dL
Spec Grav, UA: 1.015 (ref 1.010–1.025)
Urobilinogen, UA: 0.2 E.U./dL
pH, UA: 5.5 (ref 5.0–8.0)

## 2023-06-03 MED ORDER — METFORMIN HCL 500 MG PO TABS: 1000.0000 mg | ORAL_TABLET | Freq: Two times a day (BID) | ORAL | 0 refills | Status: DC

## 2023-06-03 MED ORDER — CETIRIZINE HCL 10 MG PO TABS: 10.0000 mg | ORAL_TABLET | Freq: Every day | ORAL | 0 refills | Status: DC

## 2023-06-03 MED ORDER — FAMOTIDINE 20 MG PO TABS: 20.0000 mg | ORAL_TABLET | Freq: Two times a day (BID) | ORAL | 0 refills | Status: DC

## 2023-06-03 NOTE — ED Triage Notes (Signed)
Patient here today with c/o itchy rash all over his body and face for 2-3 weeks. Heat helps from the shower.

## 2023-06-03 NOTE — Discharge Instructions (Signed)
Unfortunately, because of your elevated blood sugar we cannot use steroids.  I have called in cetirizine and famotidine to help with the itching and hopefully manage the rash.  Use hypoallergenic soaps and detergents.  Is very important that we get your blood sugar under control.  Avoid carbohydrates and drink plenty of fluid.  I have refilled your metformin.  Please follow-up with your primary care soon as possible.  If you have any worsening or changing symptoms you need to be seen immediately including nausea, vomiting, shortness of breath, confusion, spread of rash.

## 2023-06-03 NOTE — ED Provider Notes (Signed)
[facility]    CSN: [id number] Arrival date & time: 06/03/23  1513      History   Chief Complaint Chief Complaint  Patient presents with   Rash    HPI Chris Gray is a 42 y.o. male.   Patient presents today with a several week history of pruritic rash on his body and face.  He denies any changes to personal hygiene products including soaps or detergents.  Denies any recent medication changes.  Denies episodes of similar symptoms in the past.  He does have a history of eczema when he was younger but has not had psoriasis or any other dermatologic conditions.  He has tried warm showers which provides temporary relief of symptoms but has not tried any topical or over-the-counter medications.  He denies any swelling of his throat, shortness of breath, oral lesions.  He denies any medication changes or antibiotic use.  Denies any exposure to plants, insects, animals.  Symptoms are intensely pruritic and preventing him from sleeping.  He does have a history of diabetes but reports that he is compliant with his metformin and his blood sugars are adequately controlled.  His last A1c obtained 12/24/2020 slightly above goal at 7.7% we do not have any more recent studies.    Past Medical History:  Diagnosis Date   Diabetes mellitus without complication (HCC)    Hypertension     Patient Active Problem List   Diagnosis Date Noted   Tobacco use disorder, continuous 09/27/2020   Secondary hypertension 09/27/2020   T2DM (type 2 diabetes mellitus) (HCC) 07/17/2017   MRSA (methicillin resistant staph aureus) culture positive 07/14/2017    Past Surgical History:  Procedure Laterality Date   ANKLE SURGERY Right    PT reports pins and screws are present       Home Medications    Prior to Admission medications   Medication Sig Start Date End Date Taking? Authorizing Provider  cetirizine (ZYRTEC ALLERGY) 10 MG tablet Take 1 tablet (10 mg total) by mouth daily. 06/03/23  Yes  Kerrion Kemppainen K, PA-C  famotidine (PEPCID) 20 MG tablet Take 1 tablet (20 mg total) by mouth 2 (two) times daily. 06/03/23  Yes Laquita Harlan K, PA-C  triamcinolone (KENALOG) 0.1 % Apply 1 application topically 2 (two) times daily. 12/24/20  Yes Barbette Merino, NP  blood glucose meter kit and supplies KIT Dispense based on patient and insurance preference. Use up to four times daily as directed. (FOR ICD-9 250.00, 250.01). 06/12/20   Barbette Merino, NP  glucose blood (RELION TRUE METRIX TEST STRIPS) test strip Use as instructed 07/25/20   Barbette Merino, NP  metFORMIN (GLUCOPHAGE) 500 MG tablet Take 2 tablets (1,000 mg total) by mouth 2 (two) times daily with a meal. 06/03/23   Atiya Yera, Noberto Retort, PA-C    Family History Family History  Problem Relation Age of Onset   Healthy Mother    Heart attack Father     Social History Social History   Tobacco Use   Smoking status: Every Day    Types: Cigarettes   Smokeless tobacco: Never   Tobacco comments:    occasionally  Vaping Use   Vaping status: Never Used  Substance Use Topics   Alcohol use: Yes    Comment: QOD   Drug use: Yes    Types: Marijuana     Allergies   Patient has no known allergies.   Review of Systems Review of Systems  Constitutional:  Positive  for activity change. Negative for appetite change, fatigue and fever.  Respiratory:  Negative for cough and shortness of breath.   Cardiovascular:  Negative for chest pain.  Gastrointestinal:  Negative for abdominal pain, diarrhea, nausea and vomiting.  Skin:  Positive for rash. Negative for color change and wound.     Physical Exam Triage Vital Signs ED Triage Vitals [06/03/23 1716]  Encounter Vitals Group     BP (!) 160/100     Systolic BP Percentile      Diastolic BP Percentile      Pulse Rate 91     Resp 16     Temp 98.4 F (36.9 C)     Temp Source Oral     SpO2 98 %     Weight 245 lb (111.1 kg)     Height 5\' 11"  (1.803 m)     Head Circumference      Peak  Flow      Pain Score 0     Pain Loc      Pain Education      Exclude from Growth Chart    No data found.  Updated Vital Signs BP (!) 160/100 (BP Location: Left Arm)   Pulse 91   Temp 98.4 F (36.9 C) (Oral)   Resp 16   Ht 5\' 11"  (1.803 m)   Wt 245 lb (111.1 kg)   SpO2 98%   BMI 34.17 kg/m   Visual Acuity Right Eye Distance:   Left Eye Distance:   Bilateral Distance:    Right Eye Near:   Left Eye Near:    Bilateral Near:     Physical Exam Vitals reviewed.  Constitutional:      General: He is awake.     Appearance: Normal appearance. He is well-developed. He is not ill-appearing.     Comments: Very pleasant male appears stated age in no acute distress sitting comfortably in exam room  HENT:     Head: Normocephalic and atraumatic.     Mouth/Throat:     Pharynx: Uvula midline. No oropharyngeal exudate, posterior oropharyngeal erythema or uvula swelling.  Cardiovascular:     Rate and Rhythm: Normal rate and regular rhythm.     Heart sounds: Normal heart sounds, S1 normal and S2 normal. No murmur heard. Pulmonary:     Effort: Pulmonary effort is normal.     Breath sounds: Normal breath sounds. No stridor. No wheezing, rhonchi or rales.     Comments: Clear to auscultation bilaterally Abdominal:     General: Bowel sounds are normal.     Palpations: Abdomen is soft.     Tenderness: There is no abdominal tenderness. There is no right CVA tenderness, left CVA tenderness, guarding or rebound.  Skin:    Findings: Rash present. Rash is urticarial.     Comments: Widespread urticarial lesions noted with evidence of excoriation.  Neurological:     Mental Status: He is alert.  Psychiatric:        Behavior: Behavior is cooperative.      UC Treatments / Results  Labs (all labs ordered are listed, but only abnormal results are displayed) Labs Reviewed  POCT FASTING CBG [facility] MANUAL ENTRY - Abnormal; Notable for the following components:      Result Value   POCT Glucose  ([facility]) 295 (*)    All other components within normal limits  POCT URINALYSIS DIP (MANUAL ENTRY) - Abnormal; Notable for the following components:   Clarity, UA cloudy (*)  Glucose, UA >=1,000 (*)    All other components within normal limits    EKG   Radiology No results found.  Procedures Procedures (including critical care time)  Medications Ordered in UC Medications - No data to display  Initial Impression / Assessment and Plan / UC Course  I have reviewed the triage vital signs and the nursing notes.  Pertinent labs & imaging results that were available during my care of the patient were reviewed by me and considered in my medical decision making (see chart for details).     Patient is well-appearing, afebrile, nontoxic, nontachycardic.  Unclear etiology of rash but I am concerned for diabetic dermopathy given the clinical presentation.  Discussed that we cannot use steroids to help manage symptoms given is likely related to his elevated glucose which was noted to be 295 in clinic today.  Urine was obtained that showed glucosuria but with no evidence of ketonuria.  He reports that he has not been taking his metformin regularly and so this was refilled with instruction to restart this medication.  Discussed that he should follow-up with his primary care soon as possible.  He is to use hypoallergenic soaps and detergents.  Will start H1 and H2 blockade to help manage pruritus and he can use moisturizing emollient for additional symptom relief.  Discussed that if he has any worsening or changing symptoms he needs to be seen immediately including nausea, vomiting, spread of rash, swelling of his throat, shortness of breath.  Strict return precautions given.  Excuse note provided.  Final Clinical Impressions(s) / UC Diagnoses   Final diagnoses:  Urticaria  Pruritic rash  Type 2 diabetes mellitus with hyperglycemia, without long-term current use of insulin (HCC)     Discharge  Instructions      Unfortunately, because of your elevated blood sugar we cannot use steroids.  I have called in cetirizine and famotidine to help with the itching and hopefully manage the rash.  Use hypoallergenic soaps and detergents.  Is very important that we get your blood sugar under control.  Avoid carbohydrates and drink plenty of fluid.  I have refilled your metformin.  Please follow-up with your primary care soon as possible.  If you have any worsening or changing symptoms you need to be seen immediately including nausea, vomiting, shortness of breath, confusion, spread of rash.     ED Prescriptions     Medication Sig Dispense Auth. Provider   metFORMIN (GLUCOPHAGE) 500 MG tablet Take 2 tablets (1,000 mg total) by mouth 2 (two) times daily with a meal. 360 tablet Magdelene Ruark K, PA-C   cetirizine (ZYRTEC ALLERGY) 10 MG tablet Take 1 tablet (10 mg total) by mouth daily. 30 tablet Analis Distler K, PA-C   famotidine (PEPCID) 20 MG tablet Take 1 tablet (20 mg total) by mouth 2 (two) times daily. 30 tablet Llewellyn Choplin, Noberto Retort, PA-C      PDMP not reviewed this encounter.   Jeani Hawking, PA-C 06/03/23 1840

## 2023-07-01 ENCOUNTER — Ambulatory Visit: Payer: Self-pay | Admitting: Nurse Practitioner

## 2024-01-29 ENCOUNTER — Encounter (HOSPITAL_COMMUNITY): Payer: Self-pay | Admitting: *Deleted

## 2024-01-29 ENCOUNTER — Inpatient Hospital Stay (HOSPITAL_COMMUNITY)

## 2024-01-29 ENCOUNTER — Inpatient Hospital Stay (HOSPITAL_COMMUNITY): Admitting: Anesthesiology

## 2024-01-29 ENCOUNTER — Emergency Department (HOSPITAL_COMMUNITY)

## 2024-01-29 ENCOUNTER — Encounter (HOSPITAL_COMMUNITY)
Admission: EM | Disposition: A | Discharge status: Home / Self Care | Payer: Self-pay | Source: Home / Self Care | Attending: Internal Medicine

## 2024-01-29 ENCOUNTER — Other Ambulatory Visit: Payer: Self-pay

## 2024-01-29 ENCOUNTER — Inpatient Hospital Stay (HOSPITAL_COMMUNITY)
Admission: EM | Admit: 2024-01-29 | Discharge: 2024-02-01 | DRG: 570 | Disposition: A | Discharge status: Home / Self Care | Attending: Internal Medicine | Admitting: Internal Medicine

## 2024-01-29 DIAGNOSIS — Z91148 Patient's other noncompliance with medication regimen for other reason: Secondary | ICD-10-CM

## 2024-01-29 DIAGNOSIS — E66811 Obesity, class 1: Secondary | ICD-10-CM | POA: Diagnosis present

## 2024-01-29 DIAGNOSIS — Z8249 Family history of ischemic heart disease and other diseases of the circulatory system: Secondary | ICD-10-CM

## 2024-01-29 DIAGNOSIS — A5139 Other secondary syphilis of skin: Secondary | ICD-10-CM | POA: Diagnosis present

## 2024-01-29 DIAGNOSIS — A5149 Other secondary syphilitic conditions: Secondary | ICD-10-CM

## 2024-01-29 DIAGNOSIS — Z8614 Personal history of Methicillin resistant Staphylococcus aureus infection: Secondary | ICD-10-CM

## 2024-01-29 DIAGNOSIS — L0231 Cutaneous abscess of buttock: Secondary | ICD-10-CM | POA: Diagnosis not present

## 2024-01-29 DIAGNOSIS — L02215 Cutaneous abscess of perineum: Secondary | ICD-10-CM | POA: Diagnosis present

## 2024-01-29 DIAGNOSIS — E876 Hypokalemia: Secondary | ICD-10-CM | POA: Diagnosis present

## 2024-01-29 DIAGNOSIS — I10 Essential (primary) hypertension: Secondary | ICD-10-CM | POA: Diagnosis present

## 2024-01-29 DIAGNOSIS — Z113 Encounter for screening for infections with a predominantly sexual mode of transmission: Secondary | ICD-10-CM

## 2024-01-29 DIAGNOSIS — F17210 Nicotine dependence, cigarettes, uncomplicated: Secondary | ICD-10-CM | POA: Diagnosis present

## 2024-01-29 DIAGNOSIS — Z7251 High risk heterosexual behavior: Secondary | ICD-10-CM

## 2024-01-29 DIAGNOSIS — E1165 Type 2 diabetes mellitus with hyperglycemia: Secondary | ICD-10-CM | POA: Diagnosis present

## 2024-01-29 DIAGNOSIS — D75839 Thrombocytosis, unspecified: Secondary | ICD-10-CM | POA: Diagnosis present

## 2024-01-29 DIAGNOSIS — E119 Type 2 diabetes mellitus without complications: Secondary | ICD-10-CM | POA: Diagnosis not present

## 2024-01-29 DIAGNOSIS — L0291 Cutaneous abscess, unspecified: Secondary | ICD-10-CM | POA: Diagnosis not present

## 2024-01-29 DIAGNOSIS — B954 Other streptococcus as the cause of diseases classified elsewhere: Secondary | ICD-10-CM | POA: Diagnosis present

## 2024-01-29 DIAGNOSIS — D509 Iron deficiency anemia, unspecified: Secondary | ICD-10-CM | POA: Diagnosis present

## 2024-01-29 DIAGNOSIS — A419 Sepsis, unspecified organism: Secondary | ICD-10-CM

## 2024-01-29 DIAGNOSIS — R7989 Other specified abnormal findings of blood chemistry: Secondary | ICD-10-CM | POA: Diagnosis present

## 2024-01-29 DIAGNOSIS — J180 Bronchopneumonia, unspecified organism: Secondary | ICD-10-CM | POA: Diagnosis present

## 2024-01-29 DIAGNOSIS — K612 Anorectal abscess: Secondary | ICD-10-CM | POA: Diagnosis present

## 2024-01-29 DIAGNOSIS — Z6831 Body mass index (BMI) 31.0-31.9, adult: Secondary | ICD-10-CM

## 2024-01-29 DIAGNOSIS — L03317 Cellulitis of buttock: Principal | ICD-10-CM | POA: Diagnosis present

## 2024-01-29 DIAGNOSIS — Z7984 Long term (current) use of oral hypoglycemic drugs: Secondary | ICD-10-CM | POA: Diagnosis not present

## 2024-01-29 DIAGNOSIS — K610 Anal abscess: Secondary | ICD-10-CM | POA: Diagnosis not present

## 2024-01-29 DIAGNOSIS — Z716 Tobacco abuse counseling: Secondary | ICD-10-CM | POA: Diagnosis not present

## 2024-01-29 DIAGNOSIS — R21 Rash and other nonspecific skin eruption: Secondary | ICD-10-CM

## 2024-01-29 DIAGNOSIS — F17209 Nicotine dependence, unspecified, with unspecified nicotine-induced disorders: Secondary | ICD-10-CM | POA: Diagnosis present

## 2024-01-29 DIAGNOSIS — A539 Syphilis, unspecified: Secondary | ICD-10-CM | POA: Diagnosis present

## 2024-01-29 HISTORY — PX: IRRIGATION AND DEBRIDEMENT ABSCESS: SHX5252

## 2024-01-29 LAB — BASIC METABOLIC PANEL WITH GFR
Anion gap: 9 (ref 5–15)
BUN: 12 mg/dL (ref 6–20)
CO2: 22 mmol/L (ref 22–32)
Calcium: 8.3 mg/dL — ABNORMAL LOW (ref 8.9–10.3)
Chloride: 100 mmol/L (ref 98–111)
Creatinine, Ser: 0.77 mg/dL (ref 0.61–1.24)
GFR, Estimated: >60 mL/min (ref >60)
Glucose, Bld: 226 mg/dL — ABNORMAL HIGH (ref 70–99)
Potassium: 3.1 mmol/L — ABNORMAL LOW (ref 3.5–5.1)
Sodium: 131 mmol/L — ABNORMAL LOW (ref 135–145)

## 2024-01-29 LAB — GLUCOSE, CAPILLARY
Glucose-Capillary: 169 mg/dL — ABNORMAL HIGH (ref 70–99)
Glucose-Capillary: 329 mg/dL — ABNORMAL HIGH (ref 70–99)
Glucose-Capillary: 250 mg/dL — ABNORMAL HIGH (ref 70–99)

## 2024-01-29 LAB — COMPREHENSIVE METABOLIC PANEL WITH GFR
ALT: 12 U/L (ref 0–44)
AST: 13 U/L — ABNORMAL LOW (ref 15–41)
Albumin: 3 g/dL — ABNORMAL LOW (ref 3.5–5.0)
Alkaline Phosphatase: 76 U/L (ref 38–126)
Anion gap: 10 (ref 5–15)
BUN: 9 mg/dL (ref 6–20)
CO2: 21 mmol/L — ABNORMAL LOW (ref 22–32)
Calcium: 8.4 mg/dL — ABNORMAL LOW (ref 8.9–10.3)
Chloride: 101 mmol/L (ref 98–111)
Creatinine, Ser: 0.69 mg/dL (ref 0.61–1.24)
GFR, Estimated: >60 mL/min (ref >60)
Glucose, Bld: 157 mg/dL — ABNORMAL HIGH (ref 70–99)
Potassium: 3.7 mmol/L (ref 3.5–5.1)
Sodium: 132 mmol/L — ABNORMAL LOW (ref 135–145)
Total Bilirubin: 1 mg/dL (ref 0.0–1.2)
Total Protein: 7.5 g/dL (ref 6.5–8.1)

## 2024-01-29 LAB — MAGNESIUM: Magnesium: 1.9 mg/dL (ref 1.7–2.4)

## 2024-01-29 LAB — HEPATITIS PANEL, ACUTE
HCV Ab: NONREACTIVE
Hep A IgM: NONREACTIVE
Hep B C IgM: NONREACTIVE
Hepatitis B Surface Ag: NONREACTIVE

## 2024-01-29 LAB — CBC WITH DIFFERENTIAL/PLATELET
Abs Immature Granulocytes: 0.19 10*3/uL — ABNORMAL HIGH (ref 0.00–0.07)
Basophils Absolute: 0.1 10*3/uL (ref 0.0–0.1)
Basophils Relative: 0 %
Eosinophils Absolute: 0.1 10*3/uL (ref 0.0–0.5)
Eosinophils Relative: 1 %
HCT: 39.5 % (ref 39.0–52.0)
Hemoglobin: 12.4 g/dL — ABNORMAL LOW (ref 13.0–17.0)
Immature Granulocytes: 1 %
Lymphocytes Relative: 7 %
Lymphs Abs: 1.6 10*3/uL (ref 0.7–4.0)
MCH: 22.1 pg — ABNORMAL LOW (ref 26.0–34.0)
MCHC: 31.4 g/dL (ref 30.0–36.0)
MCV: 70.3 fL — ABNORMAL LOW (ref 80.0–100.0)
Monocytes Absolute: 2.4 10*3/uL — ABNORMAL HIGH (ref 0.1–1.0)
Monocytes Relative: 11 %
Neutro Abs: 17.3 10*3/uL — ABNORMAL HIGH (ref 1.7–7.7)
Neutrophils Relative %: 80 %
Platelets: 446 10*3/uL — ABNORMAL HIGH (ref 150–400)
RBC: 5.62 MIL/uL (ref 4.22–5.81)
RDW: 15.6 % — ABNORMAL HIGH (ref 11.5–15.5)
WBC: 21.6 10*3/uL — ABNORMAL HIGH (ref 4.0–10.5)
nRBC: 0 % (ref 0.0–0.2)

## 2024-01-29 LAB — PHOSPHORUS: Phosphorus: 3.2 mg/dL (ref 2.5–4.6)

## 2024-01-29 LAB — I-STAT CG4 LACTIC ACID, ED
Lactic Acid, Venous: 0.5 mmol/L (ref 0.5–1.9)
Lactic Acid, Venous: 0.8 mmol/L (ref 0.5–1.9)

## 2024-01-29 LAB — RPR
RPR Ser Ql: REACTIVE — AB
RPR Titer: >=1:256 {titer}

## 2024-01-29 LAB — CBG MONITORING, ED: Glucose-Capillary: 238 mg/dL — ABNORMAL HIGH (ref 70–99)

## 2024-01-29 LAB — HIV ANTIBODY (ROUTINE TESTING W REFLEX): HIV Screen 4th Generation wRfx: NONREACTIVE

## 2024-01-29 SURGERY — IRRIGATION AND DEBRIDEMENT ABSCESS
Anesthesia: General | Site: Buttocks

## 2024-01-29 MED ORDER — ONDANSETRON HCL 4 MG/2ML IJ SOLN: 4.0000 mg | Freq: Four times a day (QID) | INTRAMUSCULAR | Status: DC | PRN

## 2024-01-29 MED ORDER — FENTANYL CITRATE (PF) 100 MCG/2ML IJ SOLN
INTRAMUSCULAR | Status: DC | PRN
  Administered 2024-01-29 (×2): 100 ug via INTRAVENOUS
  Administered 2024-01-29: 50 ug via INTRAVENOUS

## 2024-01-29 MED ORDER — PROPOFOL 10 MG/ML IV BOLUS
INTRAVENOUS | Status: AC
  Filled 2024-01-29: qty 20

## 2024-01-29 MED ORDER — MAGNESIUM SULFATE 2 GM/50ML IV SOLN
2.0000 g | Freq: Once | INTRAVENOUS | Status: AC
  Administered 2024-01-29: 2 g via INTRAVENOUS
  Filled 2024-01-29: qty 50

## 2024-01-29 MED ORDER — PIPERACILLIN-TAZOBACTAM 3.375 G IVPB
3.3750 g | Freq: Three times a day (TID) | INTRAVENOUS | Status: DC
  Administered 2024-01-30 – 2024-01-31 (×3): 3.375 g via INTRAVENOUS
  Filled 2024-01-29 (×3): qty 50

## 2024-01-29 MED ORDER — ACETAMINOPHEN 500 MG PO TABS
1000.0000 mg | ORAL_TABLET | Freq: Three times a day (TID) | ORAL | Status: DC
  Administered 2024-01-29 – 2024-02-01 (×9): 1000 mg via ORAL
  Filled 2024-01-29 (×10): qty 2

## 2024-01-29 MED ORDER — DEXAMETHASONE SODIUM PHOSPHATE 10 MG/ML IJ SOLN
INTRAMUSCULAR | Status: DC | PRN
  Administered 2024-01-29: 5 mg via INTRAVENOUS

## 2024-01-29 MED ORDER — ONDANSETRON HCL 4 MG PO TABS: 4.0000 mg | ORAL_TABLET | Freq: Four times a day (QID) | ORAL | Status: DC | PRN

## 2024-01-29 MED ORDER — ONDANSETRON HCL 4 MG/2ML IJ SOLN
INTRAMUSCULAR | Status: DC | PRN
  Administered 2024-01-29: 4 mg via INTRAVENOUS

## 2024-01-29 MED ORDER — ENOXAPARIN SODIUM 60 MG/0.6ML IJ SOSY
50.0000 mg | PREFILLED_SYRINGE | INTRAMUSCULAR | Status: DC
Start: 2024-01-30 — End: 2024-02-01
  Administered 2024-01-30 – 2024-02-01 (×3): 50 mg via SUBCUTANEOUS
  Filled 2024-01-29 (×3): qty 0.6

## 2024-01-29 MED ORDER — MIDAZOLAM HCL 2 MG/2ML IJ SOLN
INTRAMUSCULAR | Status: AC
  Filled 2024-01-29: qty 2

## 2024-01-29 MED ORDER — ROCURONIUM BROMIDE 100 MG/10ML IV SOLN
INTRAVENOUS | Status: DC | PRN
  Administered 2024-01-29: 60 mg via INTRAVENOUS
  Administered 2024-01-29: 5 mg via INTRAVENOUS

## 2024-01-29 MED ORDER — VANCOMYCIN HCL IN DEXTROSE 1-5 GM/200ML-% IV SOLN: 1000.0000 mg | Freq: Once | INTRAVENOUS | Status: DC

## 2024-01-29 MED ORDER — MIDAZOLAM HCL 5 MG/5ML IJ SOLN
INTRAMUSCULAR | Status: DC | PRN
  Administered 2024-01-29: 2 mg via INTRAVENOUS

## 2024-01-29 MED ORDER — INSULIN ASPART 100 UNIT/ML IJ SOLN
0.0000 [IU] | Freq: Three times a day (TID) | INTRAMUSCULAR | Status: DC
  Administered 2024-01-29: 15 [IU] via SUBCUTANEOUS

## 2024-01-29 MED ORDER — SODIUM CHLORIDE 0.9 % IV SOLN
2.0000 g | Freq: Once | INTRAVENOUS | Status: AC
  Administered 2024-01-29: 2 g via INTRAVENOUS
  Filled 2024-01-29: qty 12.5

## 2024-01-29 MED ORDER — BUPIVACAINE-EPINEPHRINE 0.25% -1:200000 IJ SOLN
INTRAMUSCULAR | Status: DC | PRN
  Administered 2024-01-29: 10 mL

## 2024-01-29 MED ORDER — LACTATED RINGERS IV BOLUS (SEPSIS)
500.0000 mL | Freq: Once | INTRAVENOUS | Status: AC
  Administered 2024-01-29: 500 mL via INTRAVENOUS

## 2024-01-29 MED ORDER — DROPERIDOL 2.5 MG/ML IJ SOLN: 0.6250 mg | Freq: Once | INTRAMUSCULAR | Status: DC | PRN

## 2024-01-29 MED ORDER — LIDOCAINE HCL (CARDIAC) PF 100 MG/5ML IV SOSY
PREFILLED_SYRINGE | INTRAVENOUS | Status: DC | PRN
  Administered 2024-01-29: 100 mg via INTRAVENOUS

## 2024-01-29 MED ORDER — IOHEXOL 300 MG/ML  SOLN
100.0000 mL | Freq: Once | INTRAMUSCULAR | Status: AC | PRN
  Administered 2024-01-29: 100 mL via INTRAVENOUS

## 2024-01-29 MED ORDER — LACTATED RINGERS IV SOLN: INTRAVENOUS | Status: DC | PRN

## 2024-01-29 MED ORDER — BUPIVACAINE-EPINEPHRINE (PF) 0.25% -1:200000 IJ SOLN
INTRAMUSCULAR | Status: AC
  Filled 2024-01-29: qty 30

## 2024-01-29 MED ORDER — POTASSIUM CHLORIDE IN NACL 40-0.9 MEQ/L-% IV SOLN
INTRAVENOUS | Status: AC
  Filled 2024-01-29 (×2): qty 1000

## 2024-01-29 MED ORDER — ENOXAPARIN SODIUM 60 MG/0.6ML IJ SOSY: 50.0000 mg | PREFILLED_SYRINGE | INTRAMUSCULAR | Status: DC

## 2024-01-29 MED ORDER — POTASSIUM CHLORIDE 10 MEQ/100ML IV SOLN
10.0000 meq | Freq: Once | INTRAVENOUS | Status: AC
  Administered 2024-01-29: 10 meq via INTRAVENOUS
  Filled 2024-01-29: qty 100

## 2024-01-29 MED ORDER — PENICILLIN G BENZATHINE 1200000 UNIT/2ML IM SUSY
2.4000 10*6.[IU] | PREFILLED_SYRINGE | Freq: Once | INTRAMUSCULAR | Status: AC
  Administered 2024-01-29: 2.4 10*6.[IU] via INTRAMUSCULAR
  Filled 2024-01-29: qty 4

## 2024-01-29 MED ORDER — POTASSIUM CHLORIDE CRYS ER 20 MEQ PO TBCR
40.0000 meq | EXTENDED_RELEASE_TABLET | Freq: Once | ORAL | Status: AC
  Administered 2024-01-29: 40 meq via ORAL
  Filled 2024-01-29: qty 2

## 2024-01-29 MED ORDER — LINEZOLID 600 MG/300ML IV SOLN
600.0000 mg | Freq: Two times a day (BID) | INTRAVENOUS | Status: DC
  Administered 2024-01-29 – 2024-01-30 (×3): 600 mg via INTRAVENOUS
  Filled 2024-01-29 (×4): qty 300

## 2024-01-29 MED ORDER — FENTANYL CITRATE (PF) 250 MCG/5ML IJ SOLN
INTRAMUSCULAR | Status: AC
  Filled 2024-01-29: qty 5

## 2024-01-29 MED ORDER — ACETAMINOPHEN 650 MG RE SUPP: 650.0000 mg | Freq: Four times a day (QID) | RECTAL | Status: DC | PRN

## 2024-01-29 MED ORDER — HYDROCODONE-ACETAMINOPHEN 5-325 MG PO TABS
1.0000 | ORAL_TABLET | Freq: Once | ORAL | Status: AC
  Administered 2024-01-29: 1 via ORAL
  Filled 2024-01-29: qty 1

## 2024-01-29 MED ORDER — PIPERACILLIN-TAZOBACTAM 3.375 G IVPB
INTRAVENOUS | Status: AC
  Filled 2024-01-29: qty 50

## 2024-01-29 MED ORDER — HYDROMORPHONE HCL 1 MG/ML IJ SOLN: 0.2500 mg | INTRAMUSCULAR | Status: DC | PRN

## 2024-01-29 MED ORDER — SUGAMMADEX SODIUM 200 MG/2ML IV SOLN
INTRAVENOUS | Status: DC | PRN
  Administered 2024-01-29 (×4): 100 mg via INTRAVENOUS

## 2024-01-29 MED ORDER — ACETAMINOPHEN 325 MG PO TABS: 650.0000 mg | ORAL_TABLET | Freq: Four times a day (QID) | ORAL | Status: DC | PRN

## 2024-01-29 MED ORDER — OXYCODONE HCL 5 MG PO TABS
5.0000 mg | ORAL_TABLET | ORAL | Status: DC | PRN
  Administered 2024-01-29 – 2024-02-01 (×14): 10 mg via ORAL
  Filled 2024-01-29 (×14): qty 2

## 2024-01-29 MED ORDER — ACETAMINOPHEN 500 MG PO TABS
ORAL_TABLET | ORAL | Status: AC
Start: 2024-01-29 — End: 2024-01-29
  Administered 2024-01-29: 1000 mg
  Filled 2024-01-29: qty 2

## 2024-01-29 MED ORDER — POLYETHYLENE GLYCOL 3350 17 G PO PACK: 17.0000 g | PACK | Freq: Every day | ORAL | Status: DC | PRN

## 2024-01-29 MED ORDER — PROPOFOL 10 MG/ML IV BOLUS
INTRAVENOUS | Status: DC | PRN
  Administered 2024-01-29: 200 mg via INTRAVENOUS

## 2024-01-29 MED ORDER — DOCUSATE SODIUM 100 MG PO CAPS
100.0000 mg | ORAL_CAPSULE | Freq: Two times a day (BID) | ORAL | Status: DC
  Administered 2024-01-29 – 2024-02-01 (×7): 100 mg via ORAL
  Filled 2024-01-29 (×7): qty 1

## 2024-01-29 MED ORDER — VANCOMYCIN HCL 2000 MG/400ML IV SOLN
2000.0000 mg | Freq: Once | INTRAVENOUS | Status: AC
  Administered 2024-01-29: 2000 mg via INTRAVENOUS
  Filled 2024-01-29: qty 400

## 2024-01-29 MED ORDER — LACTATED RINGERS IV SOLN: INTRAVENOUS | Status: DC

## 2024-01-29 MED ORDER — 0.9 % SODIUM CHLORIDE (POUR BTL) OPTIME
TOPICAL | Status: DC | PRN
  Administered 2024-01-29: 1000 mL

## 2024-01-29 MED ORDER — DIPHENHYDRAMINE HCL 50 MG/ML IJ SOLN
25.0000 mg | Freq: Once | INTRAMUSCULAR | Status: AC
  Administered 2024-01-29: 25 mg via INTRAVENOUS
  Filled 2024-01-29: qty 1

## 2024-01-29 MED ORDER — PIPERACILLIN-TAZOBACTAM 3.375 G IVPB
3.3750 g | Freq: Three times a day (TID) | INTRAVENOUS | Status: DC
  Administered 2024-01-29 (×2): 3.375 g via INTRAVENOUS
  Filled 2024-01-29 (×2): qty 50

## 2024-01-29 MED ORDER — FAMOTIDINE IN NACL 20-0.9 MG/50ML-% IV SOLN
20.0000 mg | Freq: Once | INTRAVENOUS | Status: AC
  Administered 2024-01-29: 20 mg via INTRAVENOUS
  Filled 2024-01-29: qty 50

## 2024-01-29 SURGICAL SUPPLY — 39 items
BAG COUNTER SPONGE SURGICOUNT (BAG) IMPLANT
BNDG GAUZE DERMACEA FLUFF 4 (GAUZE/BANDAGES/DRESSINGS) IMPLANT
COVER SURGICAL LIGHT HANDLE (MISCELLANEOUS) ×2 IMPLANT
DERMABOND ADVANCED .7 DNX12 (GAUZE/BANDAGES/DRESSINGS) IMPLANT
DRAIN PENROSE 0.5X18 (DRAIN) IMPLANT
DRAPE LAPAROSCOPIC ABDOMINAL (DRAPES) IMPLANT
DRAPE LAPAROTOMY T 102X78X121 (DRAPES) IMPLANT
DRAPE LAPAROTOMY T 98X78 PEDS (DRAPES) IMPLANT
DRAPE LAPAROTOMY TRNSV 102X78 (DRAPES) IMPLANT
DRAPE SHEET LG 3/4 BI-LAMINATE (DRAPES) IMPLANT
DRAPE UTILITY XL STRL (DRAPES) ×2 IMPLANT
ELECT CAUTERY BLADE TIP 2.5 (TIP) IMPLANT
ELECT REM PT RETURN 15FT ADLT (MISCELLANEOUS) ×2 IMPLANT
ELECTRODE CAUTERY BLDE TIP 2.5 (TIP) IMPLANT
GAUZE PACKING 1INX5YD STRL (GAUZE/BANDAGES/DRESSINGS) IMPLANT
GAUZE PACKING IODOFORM 1/4X15 (PACKING) IMPLANT
GAUZE PAD ABD 8X10 STRL (GAUZE/BANDAGES/DRESSINGS) IMPLANT
GAUZE SPONGE 4X4 12PLY STRL (GAUZE/BANDAGES/DRESSINGS) ×2 IMPLANT
GAUZE SPONGE 4X4 12PLY STRL LF (GAUZE/BANDAGES/DRESSINGS) IMPLANT
GLOVE BIO SURGEON STRL SZ7.5 (GLOVE) ×2 IMPLANT
GLOVE INDICATOR 8.0 STRL GRN (GLOVE) ×2 IMPLANT
GOWN STRL REUS W/ TWL XL LVL3 (GOWN DISPOSABLE) ×4 IMPLANT
KIT BASIN OR (CUSTOM PROCEDURE TRAY) ×2 IMPLANT
KIT TURNOVER KIT A (KITS) IMPLANT
MARKER SKIN DUAL TIP RULER LAB (MISCELLANEOUS) IMPLANT
NDL HYPO 25X1 1.5 SAFETY (NEEDLE) ×2 IMPLANT
NEEDLE HYPO 25X1 1.5 SAFETY (NEEDLE) ×1 IMPLANT
PACK GENERAL/GYN (CUSTOM PROCEDURE TRAY) ×2 IMPLANT
SET HNDPC FAN SPRY TIP SCT (DISPOSABLE) IMPLANT
SPIKE FLUID TRANSFER (MISCELLANEOUS) IMPLANT
SPONGE T-LAP 4X18 ~~LOC~~+RFID (SPONGE) IMPLANT
STAPLER SKIN PROX WIDE 3.9 (STAPLE) IMPLANT
SUT ETHILON 2 0 PS N (SUTURE) IMPLANT
SUT MNCRL AB 4-0 PS2 18 (SUTURE) IMPLANT
SUT VIC AB 3-0 SH 18 (SUTURE) IMPLANT
SWAB COLLECTION DEVICE MRSA (MISCELLANEOUS) IMPLANT
SWAB CULTURE ESWAB REG 1ML (MISCELLANEOUS) IMPLANT
SYR CONTROL 10ML LL (SYRINGE) ×2 IMPLANT
TOWEL OR 17X26 10 PK STRL BLUE (TOWEL DISPOSABLE) ×2 IMPLANT

## 2024-01-29 NOTE — H&P (Signed)
 History and Physical    Patient: Chris Gray ZOX:[id number] DOB: [birth year] DOA: 01/29/2024 DOS: the patient was seen and examined on 01/29/2024 PCP: Jerrlyn Morel, NP  Patient coming from: Home  Chief Complaint:  Chief Complaint  Patient presents with   Abscess   HPI: Chris Gray is a 43 y.o. male with medical history significant of type 2 diabetes, hypertension, tobacco use disorder, class I obesity, history of MRSA infection who presented with complaints of having an abscess in his left gluteal area for the past 2 to 3 days.  No fever, chills, but positive fatigue and malaise. He denied  rhinorrhea, sore throat, wheezing or hemoptysis.  No chest pain, palpitations, diaphoresis, PND, orthopnea or pitting edema of the lower extremities.  No abdominal pain, nausea, emesis, diarrhea, constipation, melena or hematochezia.  No flank pain, dysuria, frequency or hematuria.  No polyuria, polydipsia, polyphagia or blurred vision.  He has had a whole-body occasionally pruritic rash for several months.  Lab work: RPR titer was reactive greater than 1:256.  CBC showed white count 21.6, hemoglobin 12.4 g/dL and platelets 811.  Lactic acid was normal twice.  Sodium 131, potassium 3.1, chloride 100 and CO2 22 mmol/L.  Renal function was normal.  Glucose 226 mg/dL and calcium  was normal after correction to albumin level.  Imaging: CT abdomen/pelvis with contrast shows a confluent 9 cm phlegmon in the left gluteal fold, left perianal region with extensive surrounding subcutaneous cellulitis.  There is send small area of gas.  Bulky and reactive appearing bilateral inguinal, left iliac lymphadenopathy.  The rectum appears normal.  Positive for bilateral lower lobe peribronchial confluent groundglass opacity most consistent with acute bronchopneumonia.  No pleural effusion.  ED course: Initial vital signs were temperature 98.4 F, pulse 75, respirations 22, BP 191/96 mmHg O2 sat 100% on room air.  He  received diphenhydramine  25 mg IVP, famotidine  20 mg IVPB, cefepime  2 g IVPB, vancomycin  2000 mg IVPB, followed by Zosyn  and linezolid .  He also received magnesium  sulfate 2 g IVPB, KCl 40 mEq p.o. x 1 and 10 mEq IVPB x 1.   Review of Systems: As mentioned in the history of present illness. All other systems reviewed and are negative.  Past Medical History:  Diagnosis Date   Diabetes mellitus without complication (HCC)    Hypertension    Past Surgical History:  Procedure Laterality Date   ANKLE SURGERY Right    PT reports pins and screws are present   Social History:  reports that he has been smoking cigarettes. He has never used smokeless tobacco. He reports current alcohol use. He reports current drug use. Drug: Marijuana.  No Known Allergies  Family History  Problem Relation Age of Onset   Healthy Mother    Heart attack Father     Prior to Admission medications   Medication Sig Start Date End Date Taking? Authorizing Provider  blood glucose meter kit and supplies KIT Dispense based on patient and insurance preference. Use up to four times daily as directed. (FOR ICD-9 250.00, 250.01). 06/12/20  Yes Gregoria Leas, NP  glucose blood (RELION TRUE METRIX TEST STRIPS) test strip Use as instructed 07/25/20  Yes Gregoria Leas, NP  metFORMIN  (GLUCOPHAGE ) 500 MG tablet Take 2 tablets (1,000 mg total) by mouth 2 (two) times daily with a meal. 06/03/23  Yes Budd Cargo, PA-C    Physical Exam: Vitals:   01/29/24 0512 01/29/24 0600 01/29/24 0630 01/29/24 0700  BP:  (!) 167/77 [name])  162/79 (!) 178/93  Pulse:  94 97 (!) 102  Resp:  17 16   Temp: 98.3 F (36.8 C) 98.4 F (36.9 C)    TempSrc: Oral Oral    SpO2:  98% 94% 95%  Weight:      Height:       Physical Exam Vitals and nursing note reviewed.  Constitutional:      General: He is awake. He is not in acute distress.    Appearance: Normal appearance. He is obese. He is ill-appearing.  HENT:     Head: Normocephalic.      Nose: No rhinorrhea.     Mouth/Throat:     Mouth: Mucous membranes are moist.  Eyes:     General: No scleral icterus.    Pupils: Pupils are equal, round, and reactive to light.  Neck:     Vascular: No JVD.  Cardiovascular:     Rate and Rhythm: Normal rate.     Heart sounds: S1 normal and S2 normal.  Pulmonary:     Effort: Pulmonary effort is normal.     Breath sounds: Normal breath sounds. No wheezing, rhonchi or rales.  Abdominal:     General: Bowel sounds are normal. There is no distension.     Palpations: Abdomen is soft.     Tenderness: There is no abdominal tenderness. There is no right CVA tenderness or left CVA tenderness.  Musculoskeletal:     Cervical back: Neck supple.     Right lower leg: No edema.     Left lower leg: No edema.  Skin:    General: Skin is warm and dry.     Findings: Erythema, lesion and rash present.  Neurological:     General: No focal deficit present.     Mental Status: He is alert and oriented to person, place, and time.  Psychiatric:        Mood and Affect: Mood normal.        Behavior: Behavior normal. Behavior is cooperative.      Data Reviewed:  Results are pending, will review when available. EKG: Vent. rate 112 BPM PR interval 205 ms QRS duration 87 ms QT/QTcB 316/432 ms P-R-T axes 54 82 76 Sinus tachycardia Prolonged PR interval Probable left atrial enlargement  Assessment and Plan: Principal Problem:   Abscess Status post incision and drainage. Analgesics as needed. Antiemetics as needed. Continue Zosyn  3.375 g IVPB every 8 hours. Continue linezolid  600 mg IVPB every 12 hours. Follow-up blood culture and sensitivity.  Active Problems:   Syphilis  Seen by ID. Consult appreciated. Received IM penicillin  G. HIV test pending.    Tobacco use disorder, continuous Tobacco cessation advised. Nicotine replacement therapy ordered as needed.    Thrombocytosis In the setting of anemia and acute infection.    Microcytic  anemia Monitor hematocrit hemoglobin.    Pseudohyponatremia Secondary to hyperglycemia. Diabetes control.    Type 2 diabetes mellitus with hyperglycemia (HCC) Carbohydrate modified diet. CBG monitoring with RI SS. Check hemoglobin A1c.    Hypokalemia Replaced. Follow-up potassium level.    Class 1 obesity Current BMI 31.38 kg/m. Would benefit from lifestyle modifications. Follow-up closely with PCP and/or bariatric clinic.    Advance Care Planning:   Code Status: Full Code   Consults:   Family Communication:   Severity of Illness: The appropriate patient status for this patient is INPATIENT. Inpatient status is judged to be reasonable and necessary in order to provide the required intensity of service to ensure the  patient's safety. The patient's presenting symptoms, physical exam findings, and initial radiographic and laboratory data in the context of their chronic comorbidities is felt to place them at high risk for further clinical deterioration. Furthermore, it is not anticipated that the patient will be medically stable for discharge from the hospital within 2 midnights of admission.   * I certify that at the point of admission it is my clinical judgment that the patient will require inpatient hospital care spanning beyond 2 midnights from the point of admission due to high intensity of service, high risk for further deterioration and high frequency of surveillance required.*  Author: Danice Dural, MD 01/29/2024 7:58 AM  For on call review [email or web address].   This document was prepared using Dragon voice recognition software and may contain some unintended transcription errors.

## 2024-01-29 NOTE — ED Triage Notes (Signed)
 Pt reports 2-3 days of abscess on his left buttocks. Not draining. Denies fevers.

## 2024-01-29 NOTE — Anesthesia Procedure Notes (Signed)
 Procedure Name: Intubation Date/Time: 01/29/2024 9:57 AM  Performed by: Inez Manger, CRNAPre-anesthesia Checklist: Patient identified, Emergency Drugs available, Suction available, Patient being monitored and Timeout performed Patient Re-evaluated:Patient Re-evaluated prior to induction Oxygen Delivery Method: Circle system utilized Preoxygenation: Pre-oxygenation with 100% oxygen Induction Type: IV induction Ventilation: Mask ventilation without difficulty Laryngoscope Size: Miller and 2 Grade View: Grade I Tube type: Oral Tube size: 7.5 mm Number of attempts: 1 Airway Equipment and Method: Stylet Placement Confirmation: ETT inserted through vocal cords under direct vision, positive ETCO2 and breath sounds checked- equal and bilateral Secured at: 23 cm Tube secured with: Tape Dental Injury: Teeth and Oropharynx as per pre-operative assessment  Comments: Smooth brief induction preoxygenation 100% FiO2 on stretcher DVL x1 brief atraumatic dentition unchanged eyes taped shut bilateral before DVL

## 2024-01-29 NOTE — Op Note (Signed)
 01/29/2024  10:38 AM  PATIENT:  Chris Gray  43 y.o. male  PRE-OPERATIVE DIAGNOSIS: Left buttock abscess  POST-OPERATIVE DIAGNOSIS:  Left buttock/perianal abscess  PROCEDURE:  Procedure(s): INCISION AND DRAINAGE AND EXCISIONAL DEBRIDEMENT (2 inch x 1in)  OF LEFT BUTTOCK AND PERIANAL ABSCESS WITH SCALPEL (abscess 15 x 7 cm)  SURGEON:  Surgeon(s): [name], MD  ASSISTANTS: none   ANESTHESIA:   general  DRAINS: none   LOCAL MEDICATIONS USED:  MARCAINE      SPECIMEN:  Aspirate  DISPOSITION OF SPECIMEN:   MICRO  COUNTS:  YES  INDICATION FOR PROCEDURE: 43 year old gentleman with class III obesity and diabetes mellitus type 2 presented with a several day history of worsening left buttock pain.  He came in the emergency room was found to have a large left buttock soft tissue abscess and infection measuring at least 10 x 5 x 7 on CT imaging.  On exam the patient had significant induration along the left medial buttock measuring 15 x 7 cm.  There is an area of fluctuance in the left perianal area at about the 7 o'clock position (left anterior ) when in the prone position.  I recommended exam under anesthesia, incision and drainage in the operating room.  We discussed risk and benefits which were separately documented.  PROCEDURE: Patient was taken into the operating room at [facility]  General endotracheal anesthesia was established.  Sequential compression devices were placed.  He was then placed in the prone position with the appropriate padding.  His buttocks were prepped and draped in the usual standard surgical fashion with Betadine.  A surgical timeout was performed.  The patient had received IV antibiotics.  He had significant induration along the left medial buttock for about 15 cm vertical by about 7 cm wide.  There was an area of fluctuance in the left anterior position at about the 7 o'clock position.  I aspirated that area and got pus out.  Since this was the area  of fluctuance I made an elliptical incision sharply with a 15 blade and there was immediate drainage of significant pus as well as seropurulent fluid of about 50 cc.  I elliptically excised about a 2 inch long by 1 inch segment of skin and soft tissue.  The abscess cavity tracked anteriorly toward the perineum.  I did not palpate any rectal involvement.  The anal canal appeared normal.  I made a counterincision in the left anterior position since the overlying skin was intact but the abscess cavity tracked anteriorly toward the perineum.  A 1 inch Penrose drain was threaded through the abscess cavity and secured to itself with 2 interrupted nylon sutures.  Hemostasis was achieved with electrocautery.  I infiltrated some local in the soft tissue and deep dermis.  The abscess cavity was packed with 1 inch packing strip.  Fluffs, ABD pads and mesh underwear were then applied.  Patient was placed in the supine position and then extubated and taken to the recovery room in stable condition.  All needle, instrument, and sponge counts were correct x 2.  There were no immediate complications.  .  Tool used for debridement (curette, scapel, etc.) scalpel    Frequency of surgical debridement.   First   Measurement of total devitalized tissue (wound surface) before and after surgical debridement.  Skin was intact before surgery.  At the conclusion there was a 2 inch by 1 inch section of skin and soft tissue that had been fully excised.  [name]  Area and depth of devitalized tissue removed from wound.  A 2 inch x 1 inch section of skin and soft tissue were elliptically incised revealing a large abscess cavity measuring 15 cm by about 8 cm by about 5 cm  Blood loss and description of tissue removed.  15 cc, skin and soft tissue  .  Evidence of the progress of the wound's response to treatment.  A.  Current wound volume (current dimensions and depth).  See above  B.  Presence (and extent of) of infection.  Abscess cavity  of left buttock skin/soft tissue  C.  Presence (and extent of) of non viable tissue.  none  D.  Other material in the wound that is expected to inhibit healing.  None apparent    Was there any viable tissue removed (measurements): no   PLAN OF CARE: Admit to inpatient   PATIENT DISPOSITION:  PACU - hemodynamically stable.   Delay start of Pharmacological VTE agent (>24hrs) due to surgical blood loss or risk of bleeding:  no  [name]. [name], MD, FACS General, Bariatric, & Minimally Invasive Surgery [facility], [place]

## 2024-01-29 NOTE — Transfer of Care (Signed)
 Immediate Anesthesia Transfer of Care Note  Patient: [name]  Procedure(s) Performed: IRRIGATION AND DEBRIDEMENT ABSCESS  Patient Location: PACU  Anesthesia Type:General  Level of Consciousness: awake, alert , oriented, and patient cooperative  Airway & Oxygen Therapy: Patient Spontanous Breathing and Patient connected to face mask oxygen  Post-op Assessment: Report given to RN and Post -op Vital signs reviewed and stable  Post vital signs: Reviewed and stable  Last Vitals:  Vitals Value Taken Time  BP 147/66 01/29/24 1100  Temp    Pulse 114 01/29/24 1101  Resp 26 01/29/24 1101  SpO2 92 % 01/29/24 1101  Vitals shown include unfiled device data.  Last Pain:  Vitals:   01/29/24 0724  TempSrc:   PainSc: 8          Complications: No notable events documented.

## 2024-01-29 NOTE — ED Notes (Signed)
 Antibiotics were ordered before the code sepsis popped up, so the 1st abx was given. Also, only 1 blood culture was ordered and it was at the end of the orders and was not seen.

## 2024-01-29 NOTE — Consult Note (Signed)
 Date of Admission:  01/29/2024          Reason for Consult: Secondary syphilis and butt abscess    Referring Provider: Lucienne Ryder, MD   Assessment:  Gluteal abscess sp I and D DM Secondary syphilis   Plan:  Agree with zosyn  and zyvox  Follow up intraoperative cultures 2,400,000 units of benzathine penicillin  intramuscularly Will need his RPR is followed up either at the health department or certainly could be followed up in our clinic HIV RNA hepatitis panel Urine GC chlamydia Wrongly recommend consideration of HIV preexposure prophylaxis  Dr Shereen Dike is taking over the service tomorrow.  Principal Problem:   Abscess Active Problems:   Tobacco use disorder, continuous   Thrombocytosis   Pseudohyponatremia   Microcytic anemia   Type 2 diabetes mellitus with hyperglycemia (HCC)   Hypokalemia   Class 1 obesity   Scheduled Meds:  acetaminophen   1,000 mg Oral Q8H   docusate sodium   100 mg Oral BID   [START ON 01/30/2024] enoxaparin  (LOVENOX ) injection  50 mg Subcutaneous Q24H   insulin  aspart  0-20 Units Subcutaneous TID WC   Continuous Infusions:  0.9 % NaCl with KCl 40 mEq / L 125 mL/hr at 01/29/24 0844   linezolid  (ZYVOX ) IV 600 mg (01/29/24 1452)   piperacillin -tazobactam (ZOSYN )  IV     PRN Meds:.ondansetron  **OR** ondansetron  (ZOFRAN ) IV, oxyCODONE , polyethylene glycol  HPI: Chris Gray is a 43 y.o. male with past medical history significant for obesity diabetes mellitus and chronic rash over the last several months who presented with several day history of worsening left buttocks pain.  In the ER he had CT scan done which showed a 10 x 5 x 7 cm abscess with gas present.  He was started on Comycin and cefepime  then switched over to Zyvox  and Zosyn .  He had an RPR performed due to his diffuse chronic rash which was positive with a titer of 1-256.  tPA was not positive yet and was pending but this history is highly consistent in terms of the rash on the  RPR titer with a syphilis upon closer questioning he did also endorse that he had had a fairly nonpainful ulcer on his penis previously as well.  He says he has not been sexually active for several months but had been prior to the onset of rash he endorses only having sex with women and says he has not been sexually active over the last several months.  I went ahead and gave him a dose of 2,400,000 units of penicillin  to treat his syphilis.  He does not have any headaches neurological symptoms ocular or auditory symptoms to suggest neurosyphilis or need for lumbar puncture.  His fourth-generation HIV test was negative though I will also check an HIV RNA and hepatitis panel, WIll send urine for GC and chlamydia.  Gust the idea of HIV preexposure prophylaxis with drug such as DESCOVY Truvada Apretude and likely in June subcutaneous lenacapavir. He said he would think about this.  Cultures taken in the  OR have just been taken and are still incubating.  I think Zyvox  and Zosyn  are reasonable for now though we should be able to narrow soon  I have personally spent 80 minutes involved in face-to-face and non-face-to-face activities for this patient on the day of the visit. Professional time spent includes the following activities: Preparing to see the patient (review of tests), Obtaining and/or reviewing separately obtained history (admission/discharge record), Performing a  medically appropriate examination and/or evaluation , Ordering medications/tests/procedures, referring and communicating with other health care professionals, Documenting clinical information in the EMR, Independently interpreting results (not separately reported), Communicating results to the patient/family/caregiver, Counseling and educating the patient/family/caregiver and Care coordination (not separately reported).   Evaluation of the patient requires complex antimicrobial therapy evaluation, counseling , isolation needs to reduce  disease transmission and risk assessment and mitigation.     Review of Systems: Review of Systems  Constitutional:  Negative for chills, fever, malaise/fatigue and weight loss.  HENT:  Negative for congestion and sore throat.   Eyes:  Negative for blurred vision and photophobia.  Respiratory:  Negative for cough, shortness of breath and wheezing.   Cardiovascular:  Negative for chest pain, palpitations and leg swelling.  Gastrointestinal:  Negative for abdominal pain, blood in stool, constipation, diarrhea, heartburn, melena, nausea and vomiting.  Genitourinary:  Negative for dysuria, flank pain and hematuria.  Musculoskeletal:  Positive for myalgias. Negative for back pain, falls and joint pain.  Skin:  Negative for itching and rash.  Neurological:  Negative for dizziness, focal weakness, loss of consciousness, weakness and headaches.  Endo/Heme/Allergies:  Does not bruise/bleed easily.  Psychiatric/Behavioral:  Negative for depression and suicidal ideas. The patient does not have insomnia.     Past Medical History:  Diagnosis Date   Diabetes mellitus without complication (HCC)    Hypertension     Social History   Tobacco Use   Smoking status: Every Day    Types: Cigarettes   Smokeless tobacco: Never   Tobacco comments:    occasionally  Vaping Use   Vaping status: Never Used  Substance Use Topics   Alcohol use: Yes    Comment: QOD   Drug use: Yes    Types: Marijuana    Family History  Problem Relation Age of Onset   Healthy Mother    Heart attack Father    No Known Allergies  OBJECTIVE: Blood pressure (!) 159/86, pulse 85, temperature 98.6 F (37 C), temperature source Oral, resp. rate 17, height 5\' 11"  (1.803 m), weight 102.1 kg, SpO2 98%.  Physical Exam Constitutional:      Appearance: He is well-developed.  HENT:     Head: Normocephalic and atraumatic.  Eyes:     Conjunctiva/sclera: Conjunctivae normal.  Cardiovascular:     Rate and Rhythm: Normal  rate and regular rhythm.  Pulmonary:     Effort: Pulmonary effort is normal. No respiratory distress.     Breath sounds: No wheezing.  Abdominal:     General: There is no distension.     Palpations: Abdomen is soft.  Musculoskeletal:        General: No tenderness. Normal range of motion.     Cervical back: Normal range of motion and neck supple.  Skin:    General: Skin is warm and dry.     Coloration: Skin is not pale.     Findings: Rash present. No erythema.  Neurological:     General: No focal deficit present.     Mental Status: He is alert and oriented to person, place, and time.  Psychiatric:        Mood and Affect: Mood normal.        Behavior: Behavior normal.        Thought Content: Thought content normal.        Judgment: Judgment normal.    Rash          Lab Results Lab Results  Component Value  Date   WBC 21.6 (H) 01/29/2024   HGB 12.4 (L) 01/29/2024   HCT 39.5 01/29/2024   MCV 70.3 (L) 01/29/2024   PLT 446 (H) 01/29/2024    Lab Results  Component Value Date   CREATININE 0.69 01/29/2024   BUN 9 01/29/2024   NA 132 (L) 01/29/2024   K 3.7 01/29/2024   CL 101 01/29/2024   CO2 21 (L) 01/29/2024    Lab Results  Component Value Date   ALT 12 01/29/2024   AST 13 (L) 01/29/2024   ALKPHOS 76 01/29/2024   BILITOT 1.0 01/29/2024     Microbiology: Recent Results (from the past 240 hours)  Culture, blood (single)     Status: None (Preliminary result)   Collection Time: 01/29/24  4:30 AM   Specimen: BLOOD RIGHT HAND  Result Value Ref Range Status   Specimen Description   Final    BLOOD RIGHT HAND Performed at [facility], [address] [place], [place] [address]    Special Requests   Final    BOTTLES DRAWN AEROBIC AND ANAEROBIC Blood Culture results may not be optimal due to an inadequate volume of blood received in culture bottles Performed at [facility], [address], [place], [place] [address]    Culture   Final     NO GROWTH < 12 HOURS Performed at [facility], [address], [place], [place] [address]    Report Status PENDING  Incomplete    Sheela Denmark, MD [facility] [phone] pager  01/29/2024, 4:41 PM

## 2024-01-29 NOTE — Anesthesia Preprocedure Evaluation (Addendum)
 Anesthesia Evaluation  Patient identified by MRN, date of birth, ID band Patient awake    Reviewed: Allergy & Precautions, NPO status , Patient's Chart, lab work & pertinent test results  History of Anesthesia Complications Negative for: history of anesthetic complications  Airway Mallampati: II  TM Distance: >3 FB Neck ROM: Full    Dental  (+) Missing,    Pulmonary Current Smoker   Pulmonary exam normal        Cardiovascular hypertension, Normal cardiovascular exam     Neuro/Psych    GI/Hepatic ,,,(+)     substance abuse  marijuana use  Endo/Other  diabetes, Type 2, Oral Hypoglycemic Agents    Renal/GU      Musculoskeletal   Abdominal   Peds  Hematology  (+) Blood dyscrasia (Hgb 12.4), anemia   Anesthesia Other Findings   Reproductive/Obstetrics                             Anesthesia Physical Anesthesia Plan  ASA: 2  Anesthesia Plan: General   Post-op Pain Management: Tylenol  PO (pre-op)* and Dilaudid  IV   Induction: Intravenous  PONV Risk Score and Plan: Treatment may vary due to age or medical condition, Ondansetron , Dexamethasone  and Midazolam   Airway Management Planned: Oral ETT  Additional Equipment: None  Intra-op Plan:   Post-operative Plan: Extubation in OR  Informed Consent: I have reviewed the patients History and Physical, chart, labs and discussed the procedure including the risks, benefits and alternatives for the proposed anesthesia with the patient or authorized representative who has indicated his/her understanding and acceptance.     Dental advisory given  Plan Discussed with: CRNA  Anesthesia Plan Comments:         Anesthesia Quick Evaluation

## 2024-01-29 NOTE — Plan of Care (Signed)
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
## 2024-01-29 NOTE — Sepsis Progress Note (Signed)
 Elink following code sepsis

## 2024-01-29 NOTE — Anesthesia Postprocedure Evaluation (Signed)
 Anesthesia Post Note  Patient: Chris Gray  Procedure(s) Performed: IRRIGATION AND DEBRIDEMENT ABSCESS (Buttocks)     Patient location during evaluation: PACU Anesthesia Type: General Level of consciousness: awake and alert Pain management: pain level controlled Vital Signs Assessment: post-procedure vital signs reviewed and stable Respiratory status: spontaneous breathing, nonlabored ventilation and respiratory function stable Cardiovascular status: blood pressure returned to baseline Postop Assessment: no apparent nausea or vomiting Anesthetic complications: no   No notable events documented.  Last Vitals:  Vitals:   01/29/24 1130 01/29/24 1145  BP: (!) 146/73 (!) 152/79  Pulse: 98 100  Resp: 19 18  Temp:    SpO2: 94% 97%    Last Pain:  Vitals:   01/29/24 1130  TempSrc:   PainSc: 0-No pain                 Rayfield Cairo

## 2024-01-30 ENCOUNTER — Encounter (HOSPITAL_COMMUNITY): Payer: Self-pay | Admitting: General Surgery

## 2024-01-30 DIAGNOSIS — L0291 Cutaneous abscess, unspecified: Secondary | ICD-10-CM | POA: Diagnosis not present

## 2024-01-30 DIAGNOSIS — L0231 Cutaneous abscess of buttock: Secondary | ICD-10-CM | POA: Diagnosis not present

## 2024-01-30 DIAGNOSIS — A5149 Other secondary syphilitic conditions: Secondary | ICD-10-CM | POA: Diagnosis not present

## 2024-01-30 LAB — COMPREHENSIVE METABOLIC PANEL WITH GFR
ALT: 11 U/L (ref 0–44)
AST: 10 U/L — ABNORMAL LOW (ref 15–41)
Albumin: 2.7 g/dL — ABNORMAL LOW (ref 3.5–5.0)
Alkaline Phosphatase: 69 U/L (ref 38–126)
Anion gap: 9 (ref 5–15)
BUN: 12 mg/dL (ref 6–20)
CO2: 21 mmol/L — ABNORMAL LOW (ref 22–32)
Calcium: 8.3 mg/dL — ABNORMAL LOW (ref 8.9–10.3)
Chloride: 101 mmol/L (ref 98–111)
Creatinine, Ser: 0.57 mg/dL — ABNORMAL LOW (ref 0.61–1.24)
GFR, Estimated: >60 mL/min (ref >60)
Glucose, Bld: 249 mg/dL — ABNORMAL HIGH (ref 70–99)
Potassium: 4.2 mmol/L (ref 3.5–5.1)
Sodium: 131 mmol/L — ABNORMAL LOW (ref 135–145)
Total Bilirubin: 0.5 mg/dL (ref 0.0–1.2)
Total Protein: 6.7 g/dL (ref 6.5–8.1)

## 2024-01-30 LAB — GLUCOSE, CAPILLARY
Glucose-Capillary: 225 mg/dL — ABNORMAL HIGH (ref 70–99)
Glucose-Capillary: 294 mg/dL — ABNORMAL HIGH (ref 70–99)
Glucose-Capillary: 158 mg/dL — ABNORMAL HIGH (ref 70–99)

## 2024-01-30 LAB — HIV-1 RNA QUANT-NO REFLEX-BLD
HIV 1 RNA Quant: <20 {copies}/mL
LOG10 HIV-1 RNA: UNDETERMINED {Log_copies}/mL

## 2024-01-30 LAB — CBC
HCT: 38.7 % — ABNORMAL LOW (ref 39.0–52.0)
Hemoglobin: 11.9 g/dL — ABNORMAL LOW (ref 13.0–17.0)
MCH: 22.2 pg — ABNORMAL LOW (ref 26.0–34.0)
MCHC: 30.7 g/dL (ref 30.0–36.0)
MCV: 72.3 fL — ABNORMAL LOW (ref 80.0–100.0)
Platelets: 478 10*3/uL — ABNORMAL HIGH (ref 150–400)
RBC: 5.35 MIL/uL (ref 4.22–5.81)
RDW: 15.8 % — ABNORMAL HIGH (ref 11.5–15.5)
WBC: 28.1 10*3/uL — ABNORMAL HIGH (ref 4.0–10.5)
nRBC: 0 % (ref 0.0–0.2)

## 2024-01-30 LAB — GC/CHLAMYDIA PROBE AMP (~~LOC~~) NOT AT ARMC
Chlamydia: NEGATIVE
Comment: NEGATIVE
Comment: NORMAL
Neisseria Gonorrhea: NEGATIVE

## 2024-01-30 LAB — HEMOGLOBIN A1C
Hgb A1c MFr Bld: 9.2 % — ABNORMAL HIGH (ref 4.8–5.6)
Mean Plasma Glucose: 217 mg/dL

## 2024-01-30 MED ORDER — INSULIN GLARGINE 100 UNIT/ML ~~LOC~~ SOLN
15.0000 [IU] | Freq: Every day | SUBCUTANEOUS | Status: DC
  Filled 2024-01-30: qty 0.15

## 2024-01-30 MED ORDER — INSULIN ASPART 100 UNIT/ML IJ SOLN
0.0000 [IU] | Freq: Three times a day (TID) | INTRAMUSCULAR | Status: DC
  Administered 2024-01-30: 3 [IU] via SUBCUTANEOUS
  Administered 2024-01-30: 7 [IU] via SUBCUTANEOUS
  Administered 2024-01-31 – 2024-02-01 (×3): 2 [IU] via SUBCUTANEOUS

## 2024-01-30 MED ORDER — INSULIN ASPART 100 UNIT/ML IJ SOLN: 0.0000 [IU] | Freq: Every day | INTRAMUSCULAR | Status: DC

## 2024-01-30 MED ORDER — LINEZOLID 600 MG PO TABS
600.0000 mg | ORAL_TABLET | Freq: Two times a day (BID) | ORAL | Status: DC
  Administered 2024-01-30 – 2024-01-31 (×2): 600 mg via ORAL
  Filled 2024-01-30 (×2): qty 1

## 2024-01-30 MED ORDER — OXYCODONE HCL 5 MG PO TABS: 5.0000 mg | ORAL_TABLET | Freq: Four times a day (QID) | ORAL | 0 refills | Status: DC | PRN

## 2024-01-30 MED ORDER — INSULIN GLARGINE-YFGN 100 UNIT/ML ~~LOC~~ SOLN
15.0000 [IU] | Freq: Every day | SUBCUTANEOUS | Status: DC
  Administered 2024-01-30 – 2024-02-01 (×3): 15 [IU] via SUBCUTANEOUS
  Filled 2024-01-30 (×3): qty 0.15

## 2024-01-30 NOTE — Plan of Care (Signed)
   Problem: Education: Goal: Knowledge of General Education information will improve Description Including pain rating scale, medication(s)/side effects and non-pharmacologic comfort measures Outcome: Progressing   Problem: Health Behavior/Discharge Planning: Goal: Ability to manage health-related needs will improve Outcome: Progressing

## 2024-01-30 NOTE — Plan of Care (Signed)
   Problem: Education: Goal: Knowledge of General Education information will improve Description: Including pain rating scale, medication(s)/side effects and non-pharmacologic comfort measures Outcome: Progressing   Problem: Activity: Goal: Risk for activity intolerance will decrease Outcome: Progressing

## 2024-01-30 NOTE — Progress Notes (Signed)
 PROGRESS NOTE  Chris Gray  DOB: September 17, 1981  PCP: Jerrlyn Morel, NP ZOX:[id number]  DOA: 01/29/2024  LOS: 1 day  Hospital Day: 2  Brief narrative: Chris Gray is a 43 y.o. male with PMH significant for DM2, HTN, smoking, obesity, history of MRSA infection. 4/20, patient presented to the ED with complaint of abscess in his left gluteal area for past 2 to 3 days.  Initial blood pressure was 191/96, Initial labs with WBC count 21.6, lactic acid level normal CT abdomen/pelvis with contrast shows a confluent 9 cm phlegmon in the left gluteal fold, left perianal region with extensive surrounding subcutaneous cellulitis.  There is send small area of gas.  Bulky and reactive appearing bilateral inguinal, left iliac lymphadenopathy.  The rectum appears normal.  Positive for bilateral lower lobe peribronchial confluent groundglass opacity most consistent with acute bronchopneumonia.  No pleural effusion.  Started on IV antibiotics Admitted to [facility] General Surgery was consulted Last night, underwent I&D of left buttock and perineal abscess.  Subjective: Patient was seen and examined this morning. Lying down bed.  Not in distress. Chart reviewed. In the last 24 hours, afebrile, heart rate in 80s, blood pressure in 160s, breathing on room air. Labs this morning with WC count 28.1, sodium 131 Fingerstick blood glucose level persistently elevated, 250s this morning.  Semglee  10 units with sliding scale  Assessment and plan: Left buttock and perianal abscess S/p I&D -4/20 Dr. [name] Currently on IV antibiotic coverage with IV Zosyn , IV Zyvox  Pending blood culture report Continue pain control with  Syphilis  Patient also complained of chronic rash over the last several months  RPR was positive with a titer of 1-256  seen by ID. Received IM penicillin  G.  Type 2 diabetes mellitus uncontrolled with hyperglycemia A1c 9.2 10/31/2023 PTA meds-metformin  Currently on SSI/Accu-Cheks  blood sugar level elevated persistently over 200 Recent Labs  Lab 01/29/24 1146 01/29/24 1635 01/29/24 2157 01/30/24 0742 01/30/24 1154  GLUCAP 169* 329* 250* 225* 294*   Hypokalemia Potassium level improved with replacement Recent Labs  Lab 01/29/24 0142 01/29/24 0830 01/30/24 0439  K 3.1* 3.7 4.2  MG  --  1.9  --   PHOS  --  3.2  --    Chronic daily smoker Tobacco cessation advised. Nicotine replacement therapy ordered as needed.   Pseudohyponatremia Secondary to hyperglycemia. Diabetes control. Recent Labs  Lab 01/29/24 0142 01/29/24 0830 01/30/24 0439  NA 131* 132* 131*   Obesity I Body mass index is 31.38 kg/m. Patient has been advised to make an attempt to improve diet and exercise patterns to aid in weight loss.   Mobility: Encourage ambulation  Goals of care   Code Status: Full Code     DVT prophylaxis:  Place and maintain sequential compression device Start: 01/30/24 1505   Antimicrobials: Per ID Fluid: None Consultants: ID, surgery Family Communication: None at bedside  Status: Inpatient Level of care:  Telemetry   Patient is from: Home Needs to continue in-hospital care: On IV antibiotics Anticipated d/c to: Hopefully home in 1 to 2 days   Diet:  Diet Order             Diet Carb Modified Fluid consistency: Thin; Room service appropriate? Yes  Diet effective now                   Scheduled Meds:  acetaminophen   1,000 mg Oral Q8H   docusate sodium   100 mg Oral BID   enoxaparin  (LOVENOX )  injection  50 mg Subcutaneous Q24H   insulin  aspart  0-5 Units Subcutaneous QHS   insulin  aspart  0-9 Units Subcutaneous TID WC   insulin  glargine-yfgn  15 Units Subcutaneous Daily   linezolid   600 mg Oral Q12H    PRN meds: ondansetron  **OR** ondansetron  (ZOFRAN ) IV, oxyCODONE , polyethylene glycol   Infusions:   piperacillin -tazobactam (ZOSYN )  IV      Antimicrobials: Anti-infectives (From admission, onward)    Start     Dose/Rate  Route Frequency Ordered Stop   01/30/24 2200  linezolid  (ZYVOX ) tablet 600 mg        600 mg Oral Every 12 hours 01/30/24 1500     01/30/24 1900  piperacillin -tazobactam (ZOSYN ) IVPB 3.375 g        3.375 g 12.5 mL/hr over 240 Minutes Intravenous Every 8 hours 01/29/24 1843     01/29/24 1415  penicillin  g benzathine (BICILLIN  LA) 1200000 UNIT/2ML injection 2.4 Million Units        2.4 Million Units Intramuscular  Once 01/29/24 1319 01/29/24 1444   01/29/24 1300  linezolid  (ZYVOX ) IVPB 600 mg  Status:  Discontinued        600 mg 300 mL/hr over 60 Minutes Intravenous Every 12 hours 01/29/24 0836 01/30/24 1500   01/29/24 1200  piperacillin -tazobactam (ZOSYN ) IVPB 3.375 g  Status:  Discontinued        3.375 g 12.5 mL/hr over 240 Minutes Intravenous Every 8 hours 01/29/24 0540 01/29/24 1843   01/29/24 0919  piperacillin -tazobactam (ZOSYN ) 3.375 GM/50ML IVPB       Note to Pharmacy: Suzette Espy A: cabinet override      01/29/24 0919 01/29/24 1021   01/29/24 0400  vancomycin  (VANCOCIN ) IVPB 1000 mg/200 mL premix  Status:  Discontinued        1,000 mg 200 mL/hr over 60 Minutes Intravenous  Once 01/29/24 0346 01/29/24 0347   01/29/24 0400  ceFEPIme  (MAXIPIME ) 2 g in sodium chloride  0.9 % 100 mL IVPB        2 g 200 mL/hr over 30 Minutes Intravenous  Once 01/29/24 0346 01/29/24 0433   01/29/24 0400  vancomycin  (VANCOREADY) IVPB 2000 mg/400 mL        2,000 mg 200 mL/hr over 120 Minutes Intravenous  Once 01/29/24 0347 01/29/24 0631       Objective: Vitals:   01/30/24 1006 01/30/24 1427  BP: (!) 146/86 (!) 141/83  Pulse: 81 80  Resp: 18 16  Temp: 97.9 F (36.6 C) 97.7 F (36.5 C)  SpO2: 98% 97%    Intake/Output Summary (Last 24 hours) at 01/30/2024 1506 Last data filed at 01/30/2024 1400 Gross per 24 hour  Intake 2820.05 ml  Output 1850 ml  Net 970.05 ml   Filed Weights   01/29/24 0116  Weight: 102.1 kg   Weight change:  Body mass index is 31.38 kg/m.   Physical  Exam: General exam: Pleasant, young, obese Skin: No rashes, lesions or ulcers. HEENT: Atraumatic, normocephalic, no obvious bleeding Lungs: Clear to auscultation bilaterally,  CVS: S1, S2, no murmur,   GI/Abd: Soft, nontender, nondistended, bowel sound present, CNS: Alert, awake, oriented x 3 Psychiatry: Mood appropriate,  Extremities: No pedal edema, no calf tenderness,   Data Review: I have personally reviewed the laboratory data and studies available.  F/u labs  Unresulted Labs (From admission, onward)     Start     Ordered   01/31/24 0500  Basic metabolic panel with GFR  Tomorrow morning,   R  01/30/24 1506   01/31/24 0500  CBC with Differential/Platelet  Tomorrow morning,   R        01/30/24 1506   01/29/24 0142  T.pallidum Ab, Total  Once,   R        01/29/24 0142            Admission date and time: 01/29/2024  1:05 AM   Total time spent in review of labs and imaging, patient evaluation, formulation of plan, documentation and communication with family: 45 minutes  Signed, Hoyt Macleod, MD [facility] 01/30/2024
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
## 2024-01-30 NOTE — TOC Initial Note (Signed)
 Transition of Care [facility]) - Initial/Assessment Note    Patient Details  Name: Chris Gray MRN: [id number] Date of Birth: 07-14-1981  Transition of [facility]) CM/SW Contact:    Bari Leys, RN Phone Number: 01/30/2024, 2:35 PM  Clinical Narrative:    Met with pt at bedside to introduce role of TOC/NCM and review for dc planning, pt reports he has an established PCP, no current home care services or home DME, reports he resides with his mother and feels safe returning home. TOC will continue to follow.                Expected Discharge Plan: Home/Self Care Barriers to Discharge: Continued Medical Work up   Patient Goals and CMS Choice Patient states their goals for this hospitalization and ongoing recovery are:: return home          Expected Discharge Plan and Services       Living arrangements for the past 2 months: Single Family Home                                      Prior Living Arrangements/Services Living arrangements for the past 2 months: Single Family Home Lives with:: Parents Patient language and need for interpreter reviewed:: Yes Do you feel safe going back to the place where you live?: Yes      Need for Family Participation in Patient Care: Yes (Comment) Care giver support system in place?: Yes (comment)   Criminal Activity/Legal Involvement Pertinent to Current Situation/Hospitalization: No - Comment as needed  Activities of Daily Living   ADL Screening (condition at time of admission) Independently performs ADLs?: Yes (appropriate for developmental age) Is the patient deaf or have difficulty hearing?: No Does the patient have difficulty seeing, even when wearing glasses/contacts?: No Does the patient have difficulty concentrating, remembering, or making decisions?: No  Permission Sought/Granted                  Emotional Assessment Appearance:: Appears stated age Attitude/Demeanor/Rapport: Engaged Affect (typically observed):  Accepting Orientation: : Oriented to Self, Oriented to Place, Oriented to  Time, Oriented to Situation Alcohol / Substance Use: Not Applicable Psych Involvement: No (comment)  Admission diagnosis:  Cellulitis of buttock [L03.317] Abscess [L02.91] Rash [R21] Bronchopneumonia [J18.0] Sepsis, due to unspecified organism, unspecified whether acute organ dysfunction present [facility]) [A41.9] Patient Active Problem List   Diagnosis Date Noted   Abscess 01/29/2024   Thrombocytosis 01/29/2024   Pseudohyponatremia 01/29/2024   Microcytic anemia 01/29/2024   Type 2 diabetes mellitus with hyperglycemia (HCC) 01/29/2024   Hypokalemia 01/29/2024   Class 1 obesity 01/29/2024   Syphilis 01/29/2024   Tobacco use disorder, continuous 09/27/2020   Secondary hypertension 09/27/2020   T2DM (type 2 diabetes mellitus) (HCC) 07/17/2017   MRSA (methicillin resistant staph aureus) culture positive 07/14/2017   PCP:  Jerrlyn Morel, NP Pharmacy:   [facility] [address] ([place]), [place] - [address] [place] ([place]) [place] [address] Phone: [phone] Fax: [phone]     Social Drivers of Health (SDOH) Social History: SDOH Screenings   Food Insecurity: No Food Insecurity (01/29/2024)  Housing: Low Risk  (01/29/2024)  Transportation Needs: No Transportation Needs (01/29/2024)  Utilities: Not At Risk (01/29/2024)  Depression (PHQ2-9): Low Risk  (12/24/2020)  Tobacco Use: High Risk (01/29/2024)   SDOH Interventions:     Readmission Risk Interventions  01/30/2024    2:34 PM  Readmission Risk Prevention Plan  Post Dischage Appt Complete  Medication Screening Complete  Transportation Screening Complete

## 2024-01-31 ENCOUNTER — Telehealth (HOSPITAL_COMMUNITY): Payer: Self-pay | Admitting: Pharmacy Technician

## 2024-01-31 ENCOUNTER — Other Ambulatory Visit (HOSPITAL_COMMUNITY): Payer: Self-pay

## 2024-01-31 DIAGNOSIS — A5149 Other secondary syphilitic conditions: Secondary | ICD-10-CM | POA: Diagnosis not present

## 2024-01-31 DIAGNOSIS — L0291 Cutaneous abscess, unspecified: Secondary | ICD-10-CM | POA: Diagnosis not present

## 2024-01-31 DIAGNOSIS — L0231 Cutaneous abscess of buttock: Secondary | ICD-10-CM | POA: Diagnosis not present

## 2024-01-31 LAB — GLUCOSE, CAPILLARY
Glucose-Capillary: 247 mg/dL — ABNORMAL HIGH (ref 70–99)
Glucose-Capillary: 180 mg/dL — ABNORMAL HIGH (ref 70–99)
Glucose-Capillary: 118 mg/dL — ABNORMAL HIGH (ref 70–99)
Glucose-Capillary: 170 mg/dL — ABNORMAL HIGH (ref 70–99)
Glucose-Capillary: 200 mg/dL — ABNORMAL HIGH (ref 70–99)

## 2024-01-31 LAB — CBC WITH DIFFERENTIAL/PLATELET
Abs Immature Granulocytes: 0.15 10*3/uL — ABNORMAL HIGH (ref 0.00–0.07)
Basophils Absolute: 0.1 10*3/uL (ref 0.0–0.1)
Basophils Relative: 0 %
Eosinophils Absolute: 0.2 10*3/uL (ref 0.0–0.5)
Eosinophils Relative: 1 %
HCT: 39.5 % (ref 39.0–52.0)
Hemoglobin: 12.1 g/dL — ABNORMAL LOW (ref 13.0–17.0)
Immature Granulocytes: 1 %
Lymphocytes Relative: 12 %
Lymphs Abs: 2.2 10*3/uL (ref 0.7–4.0)
MCH: 22 pg — ABNORMAL LOW (ref 26.0–34.0)
MCHC: 30.6 g/dL (ref 30.0–36.0)
MCV: 71.7 fL — ABNORMAL LOW (ref 80.0–100.0)
Monocytes Absolute: 2 10*3/uL — ABNORMAL HIGH (ref 0.1–1.0)
Monocytes Relative: 11 %
Neutro Abs: 14.2 10*3/uL — ABNORMAL HIGH (ref 1.7–7.7)
Neutrophils Relative %: 75 %
Platelets: 520 10*3/uL — ABNORMAL HIGH (ref 150–400)
RBC: 5.51 MIL/uL (ref 4.22–5.81)
RDW: 15.7 % — ABNORMAL HIGH (ref 11.5–15.5)
WBC: 18.8 10*3/uL — ABNORMAL HIGH (ref 4.0–10.5)
nRBC: 0 % (ref 0.0–0.2)

## 2024-01-31 LAB — BASIC METABOLIC PANEL WITH GFR
Anion gap: 10 (ref 5–15)
BUN: 15 mg/dL (ref 6–20)
CO2: 23 mmol/L (ref 22–32)
Calcium: 8.1 mg/dL — ABNORMAL LOW (ref 8.9–10.3)
Chloride: 98 mmol/L (ref 98–111)
Creatinine, Ser: 0.74 mg/dL (ref 0.61–1.24)
GFR, Estimated: >60 mL/min (ref >60)
Glucose, Bld: 198 mg/dL — ABNORMAL HIGH (ref 70–99)
Potassium: 3.6 mmol/L (ref 3.5–5.1)
Sodium: 131 mmol/L — ABNORMAL LOW (ref 135–145)

## 2024-01-31 LAB — T.PALLIDUM AB, TOTAL: T Pallidum Abs: REACTIVE — AB

## 2024-01-31 MED ORDER — LIVING WELL WITH DIABETES BOOK
Freq: Once | Status: AC
  Filled 2024-01-31: qty 1

## 2024-01-31 MED ORDER — GLIPIZIDE ER 2.5 MG PO TB24
2.5000 mg | ORAL_TABLET | Freq: Every day | ORAL | Status: DC
  Administered 2024-02-01: 2.5 mg via ORAL
  Filled 2024-01-31: qty 1

## 2024-01-31 MED ORDER — METFORMIN HCL 500 MG PO TABS
1000.0000 mg | ORAL_TABLET | Freq: Two times a day (BID) | ORAL | Status: DC
  Administered 2024-01-31 – 2024-02-01 (×2): 1000 mg via ORAL
  Filled 2024-01-31 (×2): qty 2

## 2024-01-31 MED ORDER — AMOXICILLIN-POT CLAVULANATE 875-125 MG PO TABS
1.0000 | ORAL_TABLET | Freq: Two times a day (BID) | ORAL | Status: DC
  Administered 2024-01-31 – 2024-02-01 (×3): 1 via ORAL
  Filled 2024-01-31 (×3): qty 1

## 2024-01-31 NOTE — Inpatient Diabetes Management (Signed)
 Inpatient Diabetes Program Recommendations  AACE/ADA: New Consensus Statement on Inpatient Glycemic Control (2015)  Target Ranges:  Prepandial:   less than 140 mg/dL      Peak postprandial:   less than 180 mg/dL (1-2 hours)      Critically ill patients:  140 - 180 mg/dL   Lab Results  Component Value Date   GLUCAP 118 (H) 01/31/2024   HGBA1C 9.2 (H) 01/29/2024    Review of Glycemic Control  Latest Reference Range & Units 01/30/24 07:42 01/30/24 11:54 01/30/24 16:44 01/30/24 20:24 01/31/24 07:21 01/31/24 11:33  Glucose-Capillary 70 - 99 mg/dL 132 (H) 440 (H) 102 (H) 158 (H) 180 (H) 118 (H)  (H): Data is abnormally high Diabetes history: Type 2 DM Outpatient Diabetes medications: Metformin  1000 mg BID Current orders for Inpatient glycemic control: Semglee  15 units every day, Novolog  0-9 units TID & HS  Inpatient Diabetes Program Recommendations:    Spoke with patient regarding outpatient diabetes management. Verifies home medication doses.  Reviewed patient's current A1c of 9.2%. Explained what a A1c is and what it measures. Also reviewed goal A1c with patient, importance of good glucose control @ home, and blood sugar goals. Reviewed patho of DM, need for improvement to glycemic control, hypo vs hyperglycemia, increased risk for infection, vascular changes and commorbidities.  Patient will need a meter at discharge. Also, interested in CGMs. Discussed application, risks and benefits, costs and how to obtain sensors. Secure chat sent to pharmacy to determine coverage. Pa started for Dexcom.  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 Reviewed importance of protein, carb alottments, and other nutritional factors.  [facility] consult and LWWDM booklet ordered. In preparation for discharge, would also add sulfonylurea. Discussed potential for insulin , but would need further education if needed at discharge.    Thanks, Marjo Sievert, MSN, RNC-OB Diabetes Coordinator [phone] (8a-5p)

## 2024-01-31 NOTE — Telephone Encounter (Signed)
 Patient Product/process development scientist completed.    The patient is insured through [employer] [facility]. Patient has [employer], may use a copay card, and/or apply for patient assistance if available.    Ran test claim for Dexcom G7 Sensors Requires Prior Authorization  Ran test claim for [employer] Not on Formulary  This test claim was processed through [employer]- copay amounts may vary at other pharmacies due to [employer], or as the patient moves through the different stages of their insurance plan.     Morgan Arab, CPHT Pharmacy Technician III Certified Patient Advocate [facility] Pharmacy Patient Advocate Team Direct Number: [phone]  Fax: [phone]

## 2024-01-31 NOTE — Telephone Encounter (Signed)
 Pharmacy Patient Advocate Encounter   Received notification from Inpatient Request that prior authorization for Dexcom G7 Sensor is required/requested.   Insurance verification completed.   The patient is insured through [facility] .   Per test claim: PA required; PA submitted to above mentioned insurance via CoverMyMeds Key/confirmation #/EOC [id number] Status is pending
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
## 2024-01-31 NOTE — Progress Notes (Signed)
 PROGRESS NOTE  Chris Gray  DOB: [birth year]  PCP: Jerrlyn Morel, NP ZOX:[id number]  DOA: 01/29/2024  LOS: 2 days  Hospital Day: 3  Brief narrative: Chris Gray is a 43 y.o. male with PMH significant for DM2, HTN, smoking, obesity, history of MRSA infection. 4/20, patient presented to the ED with complaint of abscess in his left gluteal area for past 2 to 3 days.  Initial blood pressure was 191/96, Initial labs with WBC count 21.6, lactic acid level normal CT abdomen/pelvis with contrast shows a confluent 9 cm phlegmon in the left gluteal fold, left perianal region with extensive surrounding subcutaneous cellulitis.  There is send small area of gas.  Bulky and reactive appearing bilateral inguinal, left iliac lymphadenopathy.  The rectum appears normal.  Positive for bilateral lower lobe peribronchial confluent groundglass opacity most consistent with acute bronchopneumonia.  No pleural effusion.  Started on IV antibiotics Admitted to [facility] General Surgery was consulted Last night, underwent I&D of left buttock and perineal abscess.  Subjective: Patient was seen and examined this morning.  Lying on bed.  Not in distress. In the last 24 hours, no fever, blood pressure in 150s and 160s Labs this morning with WC count better at 18.8, sodium low at 131, blood sugar level 180  Assessment and plan: Left buttock and perianal abscess S/p I&D -4/20 Dr. [name] Currently on IV antibiotic coverage per ID No growth in blood cultures so far. IntraOp culture pending.  Syphilis  Patient also complained of chronic rash over the last several months  RPR was positive with a titer of 1-256  seen by ID. Received IM penicillin  G 1 dose.  Type 2 diabetes mellitus uncontrolled with hyperglycemia A1c 9.2 10/31/2023 PTA meds-metformin  Currently on Semglee  15 units daily and SSI/Accu-Cheks.  Blood sugar level remains persistently elevated. Resume metformin  1000 mg twice daily now and also add  glipizide  2.5 mg Recent Labs  Lab 01/30/24 1154 01/30/24 1644 01/30/24 2024 01/31/24 0721 01/31/24 1133  GLUCAP 294* 247* 158* 180* 118*   Hypokalemia Potassium level improved with replacement Recent Labs  Lab 01/29/24 0142 01/29/24 0830 01/30/24 0439 01/31/24 0447  K 3.1* 3.7 4.2 3.6  MG  --  1.9  --   --   PHOS  --  3.2  --   --    Chronic daily smoker Tobacco cessation advised. Nicotine replacement therapy ordered as needed.   Pseudohyponatremia Secondary to hyperglycemia..  Mild and stable Recent Labs  Lab 01/29/24 0142 01/29/24 0830 01/30/24 0439 01/31/24 0447  NA 131* 132* 131* 131*   Obesity I Body mass index is 31.38 kg/m. Patient has been advised to make an attempt to improve diet and exercise patterns to aid in weight loss.   Mobility: Encourage ambulation  Goals of care   Code Status: Full Code     DVT prophylaxis:  Place and maintain sequential compression device Start: 01/30/24 1505   Antimicrobials: Per ID Fluid: None Consultants: ID, surgery Family Communication: None at bedside  Status: Inpatient Level of care:  Telemetry   Patient is from: Home Needs to continue in-hospital care: On antibiotic, pending culture report Anticipated d/c to: Hopefully home in 1 to 2 days   Diet:  Diet Order             Diet Carb Modified Fluid consistency: Thin; Room service appropriate? Yes  Diet effective now                   Scheduled  Meds:  acetaminophen   1,000 mg Oral Q8H   amoxicillin -clavulanate  1 tablet Oral Q12H   docusate sodium   100 mg Oral BID   enoxaparin  (LOVENOX ) injection  50 mg Subcutaneous Q24H   [START ON 02/01/2024] glipiZIDE   2.5 mg Oral Q breakfast   insulin  aspart  0-5 Units Subcutaneous QHS   insulin  aspart  0-9 Units Subcutaneous TID WC   insulin  glargine-yfgn  15 Units Subcutaneous Daily   living well with diabetes book   Does not apply Once   metFORMIN   1,000 mg Oral BID WC    PRN meds: ondansetron   **OR** ondansetron  (ZOFRAN ) IV, oxyCODONE , polyethylene glycol   Infusions:     Antimicrobials: Anti-infectives (From admission, onward)    Start     Dose/Rate Route Frequency Ordered Stop   01/31/24 1500  amoxicillin -clavulanate (AUGMENTIN ) 875-125 MG per tablet 1 tablet        1 tablet Oral Every 12 hours 01/31/24 1406     01/30/24 2200  linezolid  (ZYVOX ) tablet 600 mg  Status:  Discontinued        600 mg Oral Every 12 hours 01/30/24 1500 01/31/24 1406   01/30/24 1900  piperacillin -tazobactam (ZOSYN ) IVPB 3.375 g  Status:  Discontinued        3.375 g 12.5 mL/hr over 240 Minutes Intravenous Every 8 hours 01/29/24 1843 01/31/24 1406   01/29/24 1415  penicillin  g benzathine (BICILLIN  LA) 1200000 UNIT/2ML injection 2.4 Million Units        2.4 Million Units Intramuscular  Once 01/29/24 1319 01/29/24 1444   01/29/24 1300  linezolid  (ZYVOX ) IVPB 600 mg  Status:  Discontinued        600 mg 300 mL/hr over 60 Minutes Intravenous Every 12 hours 01/29/24 0836 01/30/24 1500   01/29/24 1200  piperacillin -tazobactam (ZOSYN ) IVPB 3.375 g  Status:  Discontinued        3.375 g 12.5 mL/hr over 240 Minutes Intravenous Every 8 hours 01/29/24 0540 01/29/24 1843   01/29/24 0919  piperacillin -tazobactam (ZOSYN ) 3.375 GM/50ML IVPB       Note to Pharmacy: Suzette Espy A: cabinet override      01/29/24 0919 01/29/24 1021   01/29/24 0400  vancomycin  (VANCOCIN ) IVPB 1000 mg/200 mL premix  Status:  Discontinued        1,000 mg 200 mL/hr over 60 Minutes Intravenous  Once 01/29/24 0346 01/29/24 0347   01/29/24 0400  ceFEPIme  (MAXIPIME ) 2 g in sodium chloride  0.9 % 100 mL IVPB        2 g 200 mL/hr over 30 Minutes Intravenous  Once 01/29/24 0346 01/29/24 0433   01/29/24 0400  vancomycin  (VANCOREADY) IVPB 2000 mg/400 mL        2,000 mg 200 mL/hr over 120 Minutes Intravenous  Once 01/29/24 0347 01/29/24 0631       Objective: Vitals:   01/31/24 0636 01/31/24 1311  BP: (!) 165/96 (!) 156/92  Pulse: 87  93  Resp: 16 18  Temp: 98.9 F (37.2 C) 97.7 F (36.5 C)  SpO2: 97% 97%    Intake/Output Summary (Last 24 hours) at 01/31/2024 1448 Last data filed at 01/31/2024 1400 Gross per 24 hour  Intake 1912.7 ml  Output 750 ml  Net 1162.7 ml   Filed Weights   01/29/24 0116  Weight: 102.1 kg   Weight change:  Body mass index is 31.38 kg/m.   Physical Exam: General exam: Pleasant, young, obese Skin: No rashes, lesions or ulcers. HEENT: Atraumatic, normocephalic, no obvious bleeding Lungs: Clear  to auscultation bilaterally,  CVS: S1, S2, no murmur,   GI/Abd: Soft, nontender, nondistended, bowel sound present, CNS: Alert, awake, oriented x 3 Psychiatry: Mood appropriate,  Extremities: No pedal edema, no calf tenderness,   Data Review: I have personally reviewed the laboratory data and studies available.  F/u labs  Unresulted Labs (From admission, onward)     Start     Ordered   01/29/24 0142  T.pallidum Ab, Total  Once,   R        01/29/24 0142            Admission date and time: 01/29/2024  1:05 AM   Total time spent in review of labs and imaging, patient evaluation, formulation of plan, documentation and communication with family: 45 minutes  Signed, Hoyt Macleod, MD [facility] 01/31/2024

## 2024-01-31 NOTE — Plan of Care (Signed)
   Problem: Education: Goal: Knowledge of General Education information will improve Description: Including pain rating scale, medication(s)/side effects and non-pharmacologic comfort measures Outcome: Progressing   Problem: Activity: Goal: Risk for activity intolerance will decrease Outcome: Progressing

## 2024-01-31 NOTE — Plan of Care (Signed)
   Problem: Education: Goal: Knowledge of General Education information will improve Description Including pain rating scale, medication(s)/side effects and non-pharmacologic comfort measures Outcome: Progressing   Problem: Health Behavior/Discharge Planning: Goal: Ability to manage health-related needs will improve Outcome: Progressing

## 2024-02-01 ENCOUNTER — Other Ambulatory Visit (HOSPITAL_COMMUNITY): Payer: Self-pay

## 2024-02-01 DIAGNOSIS — L0291 Cutaneous abscess, unspecified: Secondary | ICD-10-CM | POA: Diagnosis not present

## 2024-02-01 LAB — GLUCOSE, CAPILLARY
Glucose-Capillary: 196 mg/dL — ABNORMAL HIGH (ref 70–99)
Glucose-Capillary: 151 mg/dL — ABNORMAL HIGH (ref 70–99)

## 2024-02-01 LAB — AEROBIC/ANAEROBIC CULTURE W GRAM STAIN (SURGICAL/DEEP WOUND)

## 2024-02-01 MED ORDER — GLIPIZIDE ER 5 MG PO TB24: 5.0000 mg | ORAL_TABLET | Freq: Every day | ORAL | Status: DC

## 2024-02-01 MED ORDER — GLIPIZIDE ER 5 MG PO TB24
5.0000 mg | ORAL_TABLET | Freq: Every day | ORAL | 0 refills | Status: DC
  Filled 2024-02-01: qty 90, 90d supply, fill #0

## 2024-02-01 MED ORDER — BLOOD GLUCOSE MONITOR SYSTEM W/DEVICE KIT
1.0000 | PACK | Freq: Three times a day (TID) | 0 refills | Status: AC
  Filled 2024-02-01: qty 1, 30d supply, fill #0

## 2024-02-01 MED ORDER — OXYCODONE HCL 5 MG PO TABS
5.0000 mg | ORAL_TABLET | Freq: Four times a day (QID) | ORAL | 0 refills | Status: DC | PRN
  Filled 2024-02-01: qty 20, 5d supply, fill #0

## 2024-02-01 MED ORDER — METFORMIN HCL 1000 MG PO TABS
1000.0000 mg | ORAL_TABLET | Freq: Two times a day (BID) | ORAL | 2 refills | Status: DC
  Filled 2024-02-01: qty 60, 30d supply, fill #0

## 2024-02-01 MED ORDER — SITAGLIPTIN PHOSPHATE 25 MG PO TABS
25.0000 mg | ORAL_TABLET | Freq: Every day | ORAL | 0 refills | Status: DC
Start: 2024-02-01 — End: 2024-02-08
  Filled 2024-02-01: qty 90, 90d supply, fill #0

## 2024-02-01 MED ORDER — BLOOD GLUCOSE TEST VI STRP
1.0000 | ORAL_STRIP | Freq: Three times a day (TID) | 0 refills | Status: AC
  Filled 2024-02-01: qty 100, 34d supply, fill #0

## 2024-02-01 MED ORDER — FLORANEX PO PACK
1.0000 g | PACK | Freq: Three times a day (TID) | ORAL | 0 refills | Status: AC
Start: 2024-02-01 — End: 2024-02-22
  Filled 2024-02-01: qty 63, 21d supply, fill #0

## 2024-02-01 MED ORDER — AMOXICILLIN-POT CLAVULANATE 875-125 MG PO TABS
1.0000 | ORAL_TABLET | Freq: Two times a day (BID) | ORAL | 0 refills | Status: AC
  Filled 2024-02-01: qty 42, 21d supply, fill #0

## 2024-02-01 MED ORDER — LANCET DEVICE MISC
1.0000 | Freq: Three times a day (TID) | 0 refills | Status: AC
  Filled 2024-02-01: qty 1, fill #0

## 2024-02-01 NOTE — Progress Notes (Signed)
 TOC meds delivered in a secure bag to patient in room by tis RN. Oxycodone  was not able to be filled at this time- it was filled at another pharmacy on 4/21- pt to pick up after d/c.

## 2024-02-01 NOTE — Progress Notes (Signed)
 This RN noted that januvia  was not in pt's d/c meds. It requires a prior auth and pharmacy does not have it in stock. Dr Gwynneth Lessen & primary nurse updated via secure chat.
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
## 2024-02-01 NOTE — Plan of Care (Signed)

## 2024-02-01 NOTE — Progress Notes (Signed)
 AVS reviewed w/ pt who verbalized an understanding - No other questions at this time. PIV removed as noted

## 2024-02-02 ENCOUNTER — Telehealth: Payer: Self-pay | Admitting: *Deleted

## 2024-02-02 ENCOUNTER — Telehealth: Payer: Self-pay

## 2024-02-02 DIAGNOSIS — Z79899 Other long term (current) drug therapy: Secondary | ICD-10-CM

## 2024-02-02 NOTE — Progress Notes (Signed)
   02/02/2024  Patient ID: Chris Gray, male   DOB: August 30, 1981, 43 y.o.   MRN: [id number]  Attempted to contact patient for medication management/review. Left HIPAA compliant message for patient to return my call at their convenience.   First attempt for patient outreach. Will have care guide schedule appointment. Patient was outreached today to verify he was aware ED provider prescribed glipizide  and wanted to make sure Mr. Conran received that medication along with other ones. At this time, patient does not have a PCP and Cornelius Dill was contacted to help facilitate scheduling PCP appointment.   Thank you for allowing pharmacy to be a part of this patient's care.  Alexandria Angel, PharmD Clinical Pharmacist Cell: [phone]

## 2024-02-02 NOTE — Progress Notes (Signed)
 Care Guide Pharmacy Note  02/02/2024 Name: Chris Gray MRN: [id number] DOB: May 27, 1981  Referred By: Jerrlyn Morel, NP Reason for referral: No chief complaint on file.   Chris Gray is a 43 y.o. year old male who is a primary care patient of Jerrlyn Morel, NP.  Chris Gray was referred to the pharmacist for assistance related to: DMII  An unsuccessful telephone outreach was attempted today to contact the patient who was referred to the pharmacy team for assistance with medication management. Additional attempts will be made to contact the patient.  Chris Gray  [facility]  [facility], [facility] Guide  Direct Dial: [phone]  Fax [phone]

## 2024-02-03 LAB — CULTURE, BLOOD (SINGLE): Culture: NO GROWTH

## 2024-02-03 NOTE — Telephone Encounter (Signed)
 Pharmacy Patient Advocate Encounter  Received notification from [facility] that Prior Authorization for Dexcom G7 Sensor  has been DENIED.  Full denial letter will be uploaded to the media tab. See denial reason below.   PA #/Case ID/Reference #: [id number]

## 2024-02-06 NOTE — Progress Notes (Signed)
 Care Guide Pharmacy Note  02/06/2024 Name: Chris Gray MRN: [id number] DOB: 03-28-1981  Referred By: Jerrlyn Morel, NP Reason for referral: Complex Care Management (Initial outreach to schedule referral with PharmD)   Chris Gray is a 42 y.o. year old male who is a primary care patient of Jerrlyn Morel, NP.  Chris Gray was referred to the pharmacist for assistance related to: DMII  Successful contact was made with the patient to discuss pharmacy services including being ready for the pharmacist to call at least 5 minutes before the scheduled appointment time and to have medication bottles and any blood pressure readings ready for review. The patient agreed to meet with the pharmacist via telephone visit on (date/time).5/6 at 10:00 AM  Chris Gray  [facility], [facility] Guide  Direct Dial: [phone]  Fax [phone]

## 2024-02-08 ENCOUNTER — Encounter: Payer: Self-pay | Admitting: Nurse Practitioner

## 2024-02-08 ENCOUNTER — Ambulatory Visit (INDEPENDENT_AMBULATORY_CARE_PROVIDER_SITE_OTHER): Payer: Self-pay | Admitting: Nurse Practitioner

## 2024-02-08 VITALS — BP 132/65 | HR 84 | Temp 98.2°F | Wt 238.2 lb

## 2024-02-08 DIAGNOSIS — L0231 Cutaneous abscess of buttock: Secondary | ICD-10-CM

## 2024-02-08 DIAGNOSIS — A539 Syphilis, unspecified: Secondary | ICD-10-CM

## 2024-02-08 DIAGNOSIS — E1165 Type 2 diabetes mellitus with hyperglycemia: Secondary | ICD-10-CM

## 2024-02-08 MED ORDER — OXYCODONE HCL 5 MG PO TABS: 5.0000 mg | ORAL_TABLET | Freq: Four times a day (QID) | ORAL | 0 refills | Status: DC | PRN

## 2024-02-08 MED ORDER — SITAGLIPTIN PHOSPHATE 25 MG PO TABS: 25.0000 mg | ORAL_TABLET | Freq: Every day | ORAL | 0 refills | Status: DC

## 2024-02-08 MED ORDER — METFORMIN HCL 1000 MG PO TABS: 1000.0000 mg | ORAL_TABLET | Freq: Two times a day (BID) | ORAL | 2 refills | Status: DC

## 2024-02-08 MED ORDER — GLIPIZIDE ER 5 MG PO TB24: 5.0000 mg | ORAL_TABLET | Freq: Every day | ORAL | 0 refills | Status: DC

## 2024-02-08 NOTE — Patient Instructions (Addendum)
 1. Type 2 diabetes mellitus with hyperglycemia, without long-term current use of insulin  (HCC)  - Urine Albumin/Creatinine with ratio (send out) [LAB689]  2. Syphilis (Primary)  - Ambulatory referral to Infectious Disease  3. Left buttock abscess  - Ambulatory referral to Infectious Disease  Diabetes Mellitus and Nutrition, Adult When you have diabetes, or diabetes mellitus, it is very important to have healthy eating habits because your blood sugar (glucose) levels are greatly affected by what you eat and drink. Eating healthy foods in the right amounts, at about the same times every day, can help you: Manage your blood glucose. Lower your risk of heart disease. Improve your blood pressure. Reach or maintain a healthy weight. What can affect my meal plan? Every person with diabetes is different, and each person has different needs for a meal plan. Your health care provider may recommend that you work with a dietitian to make a meal plan that is best for you. Your meal plan may vary depending on factors such as: The calories you need. The medicines you take. Your weight. Your blood glucose, blood pressure, and cholesterol levels. Your activity level. Other health conditions you have, such as heart or kidney disease. How do carbohydrates affect me? Carbohydrates, also called carbs, affect your blood glucose level more than any other type of food. Eating carbs raises the amount of glucose in your blood. It is important to know how many carbs you can safely have in each meal. This is different for every person. Your dietitian can help you calculate how many carbs you should have at each meal and for each snack. How does alcohol affect me? Alcohol can cause a decrease in blood glucose (hypoglycemia), especially if you use insulin  or take certain diabetes medicines by mouth. Hypoglycemia can be a life-threatening condition. Symptoms of hypoglycemia, such as sleepiness, dizziness, and  confusion, are similar to symptoms of having too much alcohol. Do not drink alcohol if: Your health care provider tells you not to drink. You are pregnant, may be pregnant, or are planning to become pregnant. If you drink alcohol: Limit how much you have to: 0-1 drink a day for women. 0-2 drinks a day for men. Know how much alcohol is in your drink. In the [place] one drink equals one 12 oz bottle of beer (355 mL), one 5 oz glass of wine (148 mL), or one 1 oz glass of hard liquor (44 mL). Keep yourself hydrated with water, diet soda, or unsweetened iced tea. Keep in mind that regular soda, juice, and other mixers may contain a lot of sugar and must be counted as carbs. What are tips for following this plan?  Reading food labels Start by checking the serving size on the Nutrition Facts label of packaged foods and drinks. The number of calories and the amount of carbs, fats, and other nutrients listed on the label are based on one serving of the item. Many items contain more than one serving per package. Check the total grams (g) of carbs in one serving. Check the number of grams of saturated fats and trans fats in one serving. Choose foods that have a low amount or none of these fats. Check the number of milligrams (mg) of salt (sodium) in one serving. Most people should limit total sodium intake to less than 2,300 mg per day. Always check the nutrition information of foods labeled as "low-fat" or "nonfat." These foods may be higher in added sugar or refined carbs and should be avoided. Talk  to your dietitian to identify your daily goals for nutrients listed on the label. Shopping Avoid buying canned, pre-made, or processed foods. These foods tend to be high in fat, sodium, and added sugar. Shop around the outside edge of the grocery store. This is where you will most often find fresh fruits and vegetables, bulk grains, fresh meats, and fresh dairy products. Cooking Use low-heat cooking methods,  such as baking, instead of high-heat cooking methods, such as deep frying. Cook using healthy oils, such as olive, canola, or sunflower oil. Avoid cooking with butter, cream, or high-fat meats. Meal planning Eat meals and snacks regularly, preferably at the same times every day. Avoid going long periods of time without eating. Eat foods that are high in fiber, such as fresh fruits, vegetables, beans, and whole grains. Eat 4-6 oz (112-168 g) of lean protein each day, such as lean meat, chicken, fish, eggs, or tofu. One ounce (oz) (28 g) of lean protein is equal to: 1 oz (28 g) of meat, chicken, or fish. 1 egg.  cup (62 g) of tofu. Eat some foods each day that contain healthy fats, such as avocado, nuts, seeds, and fish. What foods should I eat? Fruits Berries. Apples. Oranges. Peaches. Apricots. Plums. Grapes. Mangoes. Papayas. Pomegranates. Kiwi. Cherries. Vegetables Leafy greens, including lettuce, spinach, kale, chard, collard greens, mustard greens, and cabbage. Beets. Cauliflower. Broccoli. Carrots. Green beans. Tomatoes. Peppers. Onions. Cucumbers. Brussels sprouts. Grains Whole grains, such as whole-wheat or whole-grain bread, crackers, tortillas, cereal, and pasta. Unsweetened oatmeal. Quinoa. Brown or wild rice. Meats and other proteins Seafood. Poultry without skin. Lean cuts of poultry and beef. Tofu. Nuts. Seeds. Dairy Low-fat or fat-free dairy products such as milk, yogurt, and cheese. The items listed above may not be a complete list of foods and beverages you can eat and drink. Contact a dietitian for more information. What foods should I avoid? Fruits Fruits canned with syrup. Vegetables Canned vegetables. Frozen vegetables with butter or cream sauce. Grains Refined white flour and flour products such as bread, pasta, snack foods, and cereals. Avoid all processed foods. Meats and other proteins Fatty cuts of meat. Poultry with skin. Breaded or fried meats. Processed  meat. Avoid saturated fats. Dairy Full-fat yogurt, cheese, or milk. Beverages Sweetened drinks, such as soda or iced tea. The items listed above may not be a complete list of foods and beverages you should avoid. Contact a dietitian for more information. Questions to ask a health care provider Do I need to meet with a certified diabetes care and education specialist? Do I need to meet with a dietitian? What number can I call if I have questions? When are the best times to check my blood glucose? Where to find more information: American Diabetes Association: diabetes.org Academy of Nutrition and Dietetics: eatright.[employer] of Diabetes and Digestive and Kidney Diseases: StageSync.si Association of Diabetes Care & Education Specialists: diabeteseducator.org Summary It is important to have healthy eating habits because your blood sugar (glucose) levels are greatly affected by what you eat and drink. It is important to use alcohol carefully. A healthy meal plan will help you manage your blood glucose and lower your risk of heart disease. Your health care provider may recommend that you work with a dietitian to make a meal plan that is best for you. This information is not intended to replace advice given to you by your health care provider. Make sure you discuss any questions you have with your health care provider. Document Revised:  04/29/2020 Document Reviewed: 04/30/2020 Elsevier Patient Education  2024 [employer].

## 2024-02-08 NOTE — Progress Notes (Signed)
 Subjective   Patient ID: Chris Gray, male    DOB: 08/24/1981, 43 y.o.   MRN: [id number]  Chief Complaint  Patient presents with   Hospitalization Follow-up    Referring provider: Jerrlyn Morel, NP  [name] is a 43 y.o. male with Past Medical History: No date: Diabetes mellitus without complication (HCC) No date: Hypertension   HPI  Patient presents today for hospital follow-up.  He was admitted to the hospital on 01/29/2024 with abscess to left buttock/perianal region.  He was discharged home on Augmentin .  He does have a drain in place.  He does follow-up with the outpatient surgery center on May 8 for drain removal.  He states that he has been stable since hospital discharge.  He was also found to have syphilis.  He was treated with injection.  It was recommended that he follow-up with infectious disease for both abscess follow-up and syphilis follow-up.  Will place referral today. Denies f/c/s, n/v/d, hemoptysis, PND, leg swelling Denies chest pain or edema        No Known Allergies  Immunization History  Administered Date(s) Administered   Tdap 10/13/2015    Tobacco History: Social History   Tobacco Use  Smoking Status Every Day   Types: Cigarettes  Smokeless Tobacco Never  Tobacco Comments   occasionally   Ready to quit: Yes Counseling given: Yes Tobacco comments: occasionally   Outpatient Encounter Medications as of 02/08/2024  Medication Sig   amoxicillin -clavulanate (AUGMENTIN ) 875-125 MG tablet Take 1 tablet by mouth every 12 (twelve) hours for 21 days.   blood glucose meter kit and supplies KIT Dispense based on patient and insurance preference. Use up to four times daily as directed. (FOR ICD-9 250.00, 250.01).   Blood Glucose Monitoring Suppl (BLOOD GLUCOSE MONITOR SYSTEM) w/Device KIT Use to test blood sugar 3 times daily.   Glucose Blood (BLOOD GLUCOSE TEST STRIPS) STRP Use as directed to test blood sugar 3 times daily.   glucose  blood (RELION TRUE METRIX TEST STRIPS) test strip Use as instructed   lactobacillus (FLORANEX/LACTINEX) PACK Take 1 packet (1 g total) by mouth 3 (three) times daily with meals for 21 days.   Lancet Device MISC 1 each by Does not apply route 3 (three) times daily. May dispense any manufacturer covered by patient's insurance.   [DISCONTINUED] glipiZIDE  (GLUCOTROL  XL) 5 MG 24 hr tablet Take 1 tablet (5 mg total) by mouth daily with breakfast.   [DISCONTINUED] metFORMIN  (GLUCOPHAGE ) 1000 MG tablet Take 1 tablet (1,000 mg total) by mouth 2 (two) times daily with a meal.   [DISCONTINUED] oxyCODONE  (OXY IR/ROXICODONE ) 5 MG immediate release tablet Take 1 tablet (5 mg total) by mouth every 6 (six) hours as needed for moderate pain (pain score 4-6).   [DISCONTINUED] sitaGLIPtin  (JANUVIA ) 25 MG tablet Take 1 tablet (25 mg total) by mouth daily.   glipiZIDE  (GLUCOTROL  XL) 5 MG 24 hr tablet Take 1 tablet (5 mg total) by mouth daily with breakfast.   metFORMIN  (GLUCOPHAGE ) 1000 MG tablet Take 1 tablet (1,000 mg total) by mouth 2 (two) times daily with a meal.   oxyCODONE  (OXY IR/ROXICODONE ) 5 MG immediate release tablet Take 1 tablet (5 mg total) by mouth every 6 (six) hours as needed for moderate pain (pain score 4-6).   sitaGLIPtin  (JANUVIA ) 25 MG tablet Take 1 tablet (25 mg total) by mouth daily.   No facility-administered encounter medications on file as of 02/08/2024.    Review of Systems  Review  of Systems  Constitutional: Negative.   HENT: Negative.    Cardiovascular: Negative.   Gastrointestinal: Negative.   Allergic/Immunologic: Negative.   Neurological: Negative.   Psychiatric/Behavioral: Negative.       Objective:   BP 132/65   Pulse 84   Temp 98.2 F (36.8 C) (Oral)   Wt 238 lb 3.2 oz (108 kg)   SpO2 98%   BMI 33.22 kg/m   Wt Readings from Last 5 Encounters:  02/08/24 238 lb 3.2 oz (108 kg)  01/29/24 225 lb (102.1 kg)  06/03/23 245 lb (111.1 kg)  12/24/20 249 lb 9.6 oz (113.2  kg)  09/24/20 253 lb (114.8 kg)     Physical Exam Vitals and nursing note reviewed.  Constitutional:      General: He is not in acute distress.    Appearance: He is well-developed.  Cardiovascular:     Rate and Rhythm: Normal rate and regular rhythm.  Pulmonary:     Effort: Pulmonary effort is normal.     Breath sounds: Normal breath sounds.  Skin:    General: Skin is warm and dry.  Neurological:     Mental Status: He is alert and oriented to person, place, and time.       Assessment & Plan:   Syphilis -     Ambulatory referral to Infectious Disease  Type 2 diabetes mellitus with hyperglycemia, without long-term current use of insulin  (HCC) -     Microalbumin / creatinine urine ratio  Left buttock abscess -     Ambulatory referral to Infectious Disease  Other orders -     glipiZIDE  ER; Take 1 tablet (5 mg total) by mouth daily with breakfast.  Dispense: 90 tablet; Refill: 0 -     metFORMIN  HCl; Take 1 tablet (1,000 mg total) by mouth 2 (two) times daily with a meal.  Dispense: 60 tablet; Refill: 2 -     SITagliptin  Phosphate; Take 1 tablet (25 mg total) by mouth daily.  Dispense: 90 tablet; Refill: 0 -     oxyCODONE  HCl; Take 1 tablet (5 mg total) by mouth every 6 (six) hours as needed for moderate pain (pain score 4-6).  Dispense: 20 tablet; Refill: 0     Return in about 3 months (around 05/09/2024).     Jerrlyn Morel, NP 02/08/2024

## 2024-02-09 LAB — MICROALBUMIN / CREATININE URINE RATIO
Creatinine, Urine: 197.1 mg/dL
Microalb/Creat Ratio: 18 mg/g{creat} (ref 0–29)
Microalbumin, Urine: 35.5 ug/mL

## 2024-02-14 ENCOUNTER — Other Ambulatory Visit: Payer: Self-pay

## 2024-02-14 ENCOUNTER — Telehealth: Payer: Self-pay

## 2024-02-14 DIAGNOSIS — E66811 Obesity, class 1: Secondary | ICD-10-CM

## 2024-02-14 DIAGNOSIS — E119 Type 2 diabetes mellitus without complications: Secondary | ICD-10-CM

## 2024-02-14 DIAGNOSIS — F17209 Nicotine dependence, unspecified, with unspecified nicotine-induced disorders: Secondary | ICD-10-CM

## 2024-02-14 NOTE — Progress Notes (Signed)
   02/14/2024  Patient ID: Chris Gray, male   DOB: 06/07/1981, 43 y.o.   MRN: [id number]  Attempted to contact patient for medication management/review. Left HIPAA compliant message for patient to return my call at their convenience.    Second attempt for patient outreach. Will have care guide reschedule telephone appointment. Prior to outreach, verified medication fill history on Dr.First, but no sold date information. Per chart review, some medications were marked delivered that were dispensed at [facility]. Januvia  was newly started April 2025.  Test claim by pharmacy techinician, for GLP-1 and GIP/GLP-1, required a prior authorization but did not indicate that they were not covered.    Thank you for allowing pharmacy to be a part of this patient's care.   Alexandria Angel, PharmD Clinical Pharmacist Cell: [phone]

## 2024-02-16 NOTE — Telephone Encounter (Signed)
 Rescheduled with Layna  Thank you   Barnie Bora  [facility]  [facility], [facility] Guide  Direct Dial: [phone]  Fax [phone]

## 2024-02-21 ENCOUNTER — Other Ambulatory Visit: Payer: Self-pay

## 2024-02-21 ENCOUNTER — Ambulatory Visit (INDEPENDENT_AMBULATORY_CARE_PROVIDER_SITE_OTHER): Admitting: Infectious Diseases

## 2024-02-21 ENCOUNTER — Encounter: Payer: Self-pay | Admitting: Infectious Diseases

## 2024-02-21 VITALS — BP 138/97 | HR 90 | Temp 98.3°F | Wt 240.0 lb

## 2024-02-21 DIAGNOSIS — Z22322 Carrier or suspected carrier of Methicillin resistant Staphylococcus aureus: Secondary | ICD-10-CM

## 2024-02-21 DIAGNOSIS — L0291 Cutaneous abscess, unspecified: Secondary | ICD-10-CM

## 2024-02-21 DIAGNOSIS — F17210 Nicotine dependence, cigarettes, uncomplicated: Secondary | ICD-10-CM | POA: Diagnosis not present

## 2024-02-21 DIAGNOSIS — E1165 Type 2 diabetes mellitus with hyperglycemia: Secondary | ICD-10-CM

## 2024-02-21 DIAGNOSIS — Z79899 Other long term (current) drug therapy: Secondary | ICD-10-CM | POA: Diagnosis not present

## 2024-02-21 DIAGNOSIS — A539 Syphilis, unspecified: Secondary | ICD-10-CM | POA: Diagnosis not present
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
## 2024-02-22 DIAGNOSIS — Z22322 Carrier or suspected carrier of Methicillin resistant Staphylococcus aureus: Secondary | ICD-10-CM | POA: Insufficient documentation

## 2024-02-28 LAB — T PALLIDUM AB: T Pallidum Abs: POSITIVE — AB

## 2024-02-28 LAB — RPR: RPR Ser Ql: REACTIVE — AB

## 2024-02-28 LAB — RPR TITER: RPR Titer: 1:512 {titer} — ABNORMAL HIGH

## 2024-02-29 ENCOUNTER — Ambulatory Visit: Payer: Self-pay | Admitting: Infectious Diseases

## 2024-02-29 NOTE — Telephone Encounter (Signed)
 Spoke with Chris Gray, relayed that Dr. Gillian Lacrosse would like him to receive an additional dose of Bicillin  due to his RPR titer increasing. He will come in tomorrow afternoon.   Anniece Bleiler, BSN, RN

## 2024-03-01 ENCOUNTER — Ambulatory Visit (INDEPENDENT_AMBULATORY_CARE_PROVIDER_SITE_OTHER)

## 2024-03-01 ENCOUNTER — Other Ambulatory Visit: Payer: Self-pay

## 2024-03-01 DIAGNOSIS — A539 Syphilis, unspecified: Secondary | ICD-10-CM

## 2024-03-01 MED ORDER — PENICILLIN G BENZATHINE 1200000 UNIT/2ML IM SUSY
1.2000 10*6.[IU] | PREFILLED_SYRINGE | Freq: Once | INTRAMUSCULAR | Status: AC
Start: 2024-03-01 — End: 2024-03-01
  Administered 2024-03-01: 1.2 10*6.[IU] via INTRAMUSCULAR

## 2024-03-01 MED ORDER — PENICILLIN G BENZATHINE 1200000 UNIT/2ML IM SUSY
1.2000 10*6.[IU] | PREFILLED_SYRINGE | Freq: Once | INTRAMUSCULAR | Status: AC
  Administered 2024-03-01: 1.2 10*6.[IU] via INTRAMUSCULAR

## 2024-03-01 NOTE — Progress Notes (Signed)
 Patient in clinic today for a one time dose of bicillin . Patient tolerated injections well and had no further questions.

## 2024-03-14 ENCOUNTER — Other Ambulatory Visit (HOSPITAL_COMMUNITY): Payer: Self-pay

## 2024-03-14 ENCOUNTER — Other Ambulatory Visit (INDEPENDENT_AMBULATORY_CARE_PROVIDER_SITE_OTHER): Payer: Self-pay

## 2024-03-14 ENCOUNTER — Other Ambulatory Visit: Payer: Self-pay

## 2024-03-14 ENCOUNTER — Telehealth: Payer: Self-pay

## 2024-03-14 DIAGNOSIS — E7801 Familial hypercholesterolemia: Secondary | ICD-10-CM

## 2024-03-14 MED ORDER — ROSUVASTATIN CALCIUM 40 MG PO TABS: 40.0000 mg | ORAL_TABLET | Freq: Every day | ORAL | 3 refills | Status: AC

## 2024-03-14 NOTE — Telephone Encounter (Addendum)
 Pharmacy Patient Advocate Encounter   Received notification from CoverMyMeds that prior authorization for OZEMPIC is required/requested.   Insurance verification completed.   The patient is insured through [facility] .   Per test claim: PA required; PA submitted to above mentioned insurance via CoverMyMeds Key/confirmation #/EOC [id number] Status is pending

## 2024-03-14 NOTE — Progress Notes (Addendum)
 03/19/2024 Name: Chris Gray MRN: [id number] DOB: [birth year]  Chief Complaint  Patient presents with   Diabetes   Hyperlipidemia    Chris Gray is a 43 y.o. year old male who presented for a telephone visit.   They were referred to the pharmacist by their PCP for assistance in managing diabetes, hypertension, and hyperlipidemia. PMH includes HTN, T2DM, tobacco use disorder/  Subjective: Patient was last seen by PCP, Abbey Hobby, NP on 02/08/24 for hospital f/u after admission for abscess on L buttock/perianal region. He was also treated for syphilis during that admission. BP at last clinic visit was 132/65, HR 84. His last A1C was 9.2%.   Today, patient reports doing well. He thinks that his BG are stable but he has not been checking regularly. He was not able to start Januvia  because it was $900 at the pharmacy. Unsure whether this is because the PA was not completed or he has a high deductible plan.   Care Team: Primary Care Provider: Jerrlyn Morel, NP ; Next Scheduled Visit: 05/09/24  Medication Access/Adherence  Current Pharmacy:  [facility] [address] [place]), [place] - [address] [place] [address] Phone: [phone] Fax: [phone]  [place] [facility] [address] [place] [address] Phone: [phone] Fax: [phone]   Patient reports affordability concerns with their medications: Yes  - see below Patient reports access/transportation concerns to their pharmacy: No  Patient reports adherence concerns with their medications:  No     Diabetes:  Current medications: glipizide  ER 5 mg daily with breakfast, metformin  IR 1000 mg BID, sitagliptin  (Januvia ) 25 mg daily (not taking, too expensive at pharmacy $900 - unsure whether he has a high deductible plan),  Medications tried in the past: N/A  Current glucose readings: N/A Using Accu-Chek Guide meter  Patient denies  hypoglycemic s/sx including dizziness, shakiness, sweating. Patient denies hyperglycemic symptoms including polyuria, polydipsia, polyphagia, nocturia, neuropathy, blurred vision. Endorses nocutria when he drinks a lot of water.   Current meal patterns: Patient reports that he avoids sugary foods and drinks. Occasional serving of bread or carbohydrate food. Did not discuss in detail today.   Current physical activity: Did not discuss today  Current medication access support: none Ambulance person  Hypertension:  Current medications: none Medications previously tried: N/A  Hyperlipidemia/ASCVD Risk Reduction  Current lipid lowering medications: none Medications tried in the past: N/A  Antiplatelet regimen:   ASCVD History:  Family History: MI in father- age 78 Risk Factors: Family hx of premature CVD, baseline LDL-C > 190 mg/dL, L8VF   Clinical ASCVD: No  The ASCVD Risk score (Arnett DK, et al., 2019) failed to calculate for the following reasons:   Cannot find a previous HDL lab   Cannot find a previous total cholesterol lab    Objective:  BP Readings from Last 3 Encounters:  02/21/24 (!) 138/97  02/08/24 132/65  02/01/24 (!) 172/99   Lab Results  Component Value Date   HGBA1C 9.2 (H) 01/29/2024   HGBA1C 7.7 (A) 12/24/2020   HGBA1C 7.7 12/24/2020   HGBA1C 7.7 (A) 12/24/2020   HGBA1C 7.7 (A) 12/24/2020   UACR 02/08/24: 18 mg/g  Lab Results  Component Value Date   CREATININE 0.74 01/31/2024   BUN 15 01/31/2024   NA 131 (L) 01/31/2024   K 3.6 01/31/2024   CL 98 01/31/2024   CO2 23 01/31/2024    Lab Results  Component Value Date   CHOL 375 (H) 06/11/2020   HDL 41 06/11/2020   LDLCALC 258 (H) 06/11/2020   TRIG 343 (H) 06/11/2020   CHOLHDL 9.1 (H) 06/11/2020    Medications Reviewed Today     Reviewed by Adra Alanis, RPH (Pharmacist) on 03/14/24 at 1558  Med List Status: <None>   Medication Order Taking? Sig Documenting Provider Last Dose Status  Informant  blood glucose meter kit and supplies KIT [id number] No Dispense based on patient and insurance preference. Use up to four times daily as directed. (FOR ICD-9 250.00, 250.01).  Patient not taking: Reported on 03/14/2024   Gregoria Leas, NP Not Taking Active Self, Pharmacy Records  Blood Glucose Monitoring Suppl (BLOOD GLUCOSE MONITOR SYSTEM) w/Device KIT [id number] No Use to test blood sugar 3 times daily.  Patient not taking: Reported on 03/14/2024   Hoyt Macleod, MD Not Taking Active   glipiZIDE  (GLUCOTROL  XL) 5 MG 24 hr tablet [id number] Yes Take 1 tablet (5 mg total) by mouth daily with breakfast. Jerrlyn Morel, NP Taking Active   Glucose Blood (BLOOD GLUCOSE TEST STRIPS) STRP [id number] No Use as directed to test blood sugar 3 times daily.  Patient not taking: Reported on 03/14/2024   Hoyt Macleod, MD Not Taking Active   glucose blood (RELION TRUE METRIX TEST STRIPS) test strip [id number] No Use as instructed  Patient not taking: Reported on 03/14/2024   Gregoria Leas, NP Not Taking Active Self, Pharmacy Records  Lancet Device MISC [id number] No 1 each by Does not apply route 3 (three) times daily. May dispense any manufacturer covered by patient's insurance.  Patient not taking: Reported on 03/14/2024   Hoyt Macleod, MD Not Taking Active   metFORMIN  (GLUCOPHAGE ) 1000 MG tablet [id number] Yes Take 1 tablet (1,000 mg total) by mouth 2 (two) times daily with a meal. Jerrlyn Morel, NP Taking Active   sitaGLIPtin  (JANUVIA ) 25 MG tablet [id number] No Take 1 tablet (25 mg total) by mouth daily.  Patient not taking: Reported on 03/14/2024   Jerrlyn Morel, NP Not Taking Active            Med Note Adra Alanis   Wed Mar 14, 2024  3:58 PM) $900 at the pharmacy              Assessment/Plan:   Diabetes: - Currently uncontrolled with last A1C of 9.2% above goal < 7%. Patient with recent infection requiring hospitalization, underlining importance of DM control. Control is suboptimal  due to needing additional therapy. Patient is tolerating metformin  and glipizide  well without s/sx of hypoglycemia, but he would benefit from a GLP-1RA. Collaborated with patient advocate team to submit a PA for Ozempic today, however I am concerned that he has a high-deductible plan. Will follow-up with plan pending PA approval/estimated cost. He is not having symptomatic hyperglycemia, but may need to initiate basal insulin  to achieve A1C goal.  - Reviewed long term cardiovascular and renal outcomes of uncontrolled blood sugar - Reviewed goal A1c, goal fasting, and goal 2 hour post prandial glucose - Reviewed dietary modifications including avoiding carbohydrate-heavy foods and maximizing protein intake.  - Reviewed lifestyle modifications including: increasing physical activity - Recommend to continue glipizide  ER 5 mg daily with breakfast, metformin  IR 1000 mg BID - Will collaborate with patient advocate team to pursue coverage of Ozempic. If this is not feasible, will consider increase in glipizide  dose vs adding pioglitazone or basal insulin .  - Patient denies personal or  family history of multiple endocrine neoplasia type 2, medullary thyroid  cancer; personal history of pancreatitis or gallbladder disease. - Recommend to check glucose once daily fasting    Hypertension: - Currently uncontrolled with last clinic BP 138/97 mmHg above goal < 130/80 mmHg. Patient is currently not taking any medication, but is hesitant to add more pills at this time. He is at high CV risk with co-morbidities and family history, but will focus on DM and HLD at this time.  - Reviewed long term cardiovascular and renal outcomes of uncontrolled blood pressure   Hyperlipidemia/ASCVD Risk Reduction: - Currently uncontrolled with LDL-C of 258 mg/dL above goal <96 mg/dL given E4VW, LDL-C > 098 mg/dL, family hx of premature ASCVD, and comorbidities. Patient should be treated with high intensity statin and will likely  require additional therapy with PCSK9i as well. Patient amenable to starting high intensity statin today.  - Reviewed long term complications of uncontrolled cholesterol - Recommend to start rosuvastatin  40 mg daily   Smoking Cessation: Did not discuss today, but will address at follow-up.  Follow Up Plan: Pharmacist in person 04/25/24, PCP 05/09/24  Arthea Larsson, PharmD PGY1 Pharmacy Resident

## 2024-03-14 NOTE — Telephone Encounter (Signed)
 Patient is a likely candidate for a GLP-1RA with uncontrolled T2DM with A1C of 9.2% on metformin  and glipizide . Will collaborate with patient advocate team to complete PA and provide estimated cost.   Arthea Larsson, PharmD PGY1 Pharmacy Resident

## 2024-03-19 ENCOUNTER — Telehealth: Payer: Self-pay

## 2024-03-19 ENCOUNTER — Other Ambulatory Visit (HOSPITAL_COMMUNITY): Payer: Self-pay

## 2024-03-19 NOTE — Telephone Encounter (Signed)
 PA for [facility]  approved through 03/18/2025  Test claim demonstrates Ozempic 0.5 mg weekly is $0 for 28 day supply.   Arthea Larsson, PharmD PGY1 Pharmacy Resident

## 2024-03-19 NOTE — Progress Notes (Signed)
 Attempted to outreach patient to discuss initiation of Ozempic, as PA was approved and copay appears to be $0 for 1 mo supply. LVM with call-back instructions.   Arthea Larsson, PharmD PGY1 Pharmacy Resident

## 2024-03-19 NOTE — Telephone Encounter (Signed)
 PA for [facility] approved through 03/18/2025  Arthea Larsson, PharmD PGY1 Pharmacy Resident

## 2024-03-21 NOTE — Progress Notes (Signed)
 Attempted to outreach patient to discuss initiation of Ozempic. LVM with call back #.   Arthea Larsson, PharmD PGY1 Pharmacy Resident

## 2024-04-05 ENCOUNTER — Ambulatory Visit: Payer: Self-pay | Admitting: Nurse Practitioner

## 2024-04-23 ENCOUNTER — Telehealth: Payer: Self-pay | Admitting: Nurse Practitioner

## 2024-04-23 NOTE — Telephone Encounter (Signed)
 Patient was identified as falling into the [place] Measure - Diabetes.   Patient was: Referred to pharmacy for chronic disease management.

## 2024-04-24 NOTE — Progress Notes (Unsigned)
 04/25/2024 Name: Chris Gray MRN: [id number] DOB: 08-27-1981  Chief Complaint  Patient presents with   Diabetes   Hyperlipidemia    Chris Gray is a 43 y.o. year old male who presented for a Face-to-Face visit   They were referred to the pharmacist by their PCP for assistance in managing diabetes, hypertension, and hyperlipidemia. PMH includes HTN, T2DM, tobacco use disorder.  Subjective: Patient was last seen by PCP, Chris Borer, NP on 02/08/24 for hospital f/u after admission for abscess on L buttock/perianal region. He was also treated for syphilis during that admission. BP at last clinic visit was 132/65, HR 84. His last A1C was 9.2%. Patient was last engaged by pharmacy via telephone on 03/14/24. He reported that he wasn't able to start Januvia  due to cost, but was taking metformin  an dglipizide. He was instructed to start rosuvastatin  40 mg daily given ASCVD risk with family hx of premature ASCVD. A PA was completed for Ozempic  ($0 for 1 mo supply), but patient was not able to be contacted with instructions to start the medication.   Today, patient reports doing well. He has been taking metformin  and glipizide  as prescribed, but he did just run out of glipizide . He is intermittently monitoring his BG. He does not have his glucometer with him today.   Care Team: Primary Care Provider: Borer Chris RAMAN, NP ; Next Scheduled Visit: 05/09/24  Medication Access/Adherence  Current Pharmacy:  [facility] [address] [place]), [place] - [address] [place] [address] Phone: [phone] Fax: [phone]  [place] [facility] [address] [place] [address] Phone: [phone] Fax: [phone]   Patient reports affordability concerns with their medications: No   Patient reports access/transportation concerns to their pharmacy: No  Patient reports adherence concerns with their medications:  No     Reports that he has been out of glipizide  for 2 days.   Diabetes:  Current medications: glipizide  ER 5 mg daily with breakfast, metformin  IR 1000 mg BID Medications tried in the past: N/A  Patient reports that he is having diarrhea with metformin   Current glucose readings: N/A Using Accu-Chek Guide meter: checks every once in a while - does not recall specific readings.  Patient denies hypoglycemic s/sx including dizziness, shakiness, sweating. Patient denies hyperglycemic symptoms including polyuria, polydipsia, polyphagia, nocturia, neuropathy, blurred vision. Endorses nocutria when he drinks a lot of water.   Current meal patterns: Patient reports that he avoids sugary foods and drinks. Occasional serving of bread or carbohydrate food. Did not discuss in detail today.   Current medication access support: none Ambulance person  Hypertension:  Current medications: none Medications previously tried: N/A  Hyperlipidemia/ASCVD Risk Reduction  Current lipid lowering medications: rosuvastatin  40 mg daily (prescribed at last visit but patient has not picked up from pharmacy) Medications tried in the past: N/A  ASCVD History:  Family History: MI in father- age 24 Risk Factors: Family hx of premature CVD, baseline LDL-C > 190 mg/dL, U7IF   Clinical ASCVD: No  The ASCVD Risk score (Arnett DK, et al., 2019) failed to calculate for the following reasons:   Cannot find a previous HDL lab   Cannot find a previous total cholesterol lab    Objective:  BP Readings from Last 3 Encounters:  02/21/24 (!) 138/97  02/08/24 132/65  02/01/24 (!) 172/99   Lab Results  Component Value Date   HGBA1C 8.9 (A) 04/25/2024  HGBA1C 9.2 (H) 01/29/2024   HGBA1C 7.7 (A) 12/24/2020   HGBA1C 7.7 12/24/2020   HGBA1C 7.7 (A) 12/24/2020   HGBA1C 7.7 (A) 12/24/2020   UACR 02/08/24: 18 mg/g  Lab Results  Component Value Date   CREATININE 0.74 01/31/2024   BUN 15 01/31/2024   NA 131 (L)  01/31/2024   K 3.6 01/31/2024   CL 98 01/31/2024   CO2 23 01/31/2024    Lab Results  Component Value Date   CHOL 375 (H) 06/11/2020   HDL 41 06/11/2020   LDLCALC 258 (H) 06/11/2020   TRIG 343 (H) 06/11/2020   CHOLHDL 9.1 (H) 06/11/2020    Medications Reviewed Today     Reviewed by Brinda Lorain SQUIBB, RPH (Pharmacist) on 04/25/24 at 1451  Med List Status: <None>   Medication Order Taking? Sig Documenting Provider Last Dose Status Informant  blood glucose meter kit and supplies KIT [id number] No Dispense based on patient and insurance preference. Use up to four times daily as directed. (FOR ICD-9 250.00, 250.01).  Patient not taking: Reported on 03/14/2024   Myrna Camelia HERO, NP Not Taking Active Self, Pharmacy Records  Blood Glucose Monitoring Suppl (BLOOD GLUCOSE MONITOR SYSTEM) w/Device KIT [id number] No Use to test blood sugar 3 times daily.  Patient not taking: Reported on 03/14/2024   Arlice Reichert, MD Not Taking Active   Discontinued 04/25/24 1406 (Change in therapy)   Glucose Blood (BLOOD GLUCOSE TEST STRIPS) STRP [id number] No Use as directed to test blood sugar 3 times daily.  Patient not taking: Reported on 03/14/2024   Arlice Reichert, MD Not Taking Active   glucose blood (RELION TRUE METRIX TEST STRIPS) test strip [id number] No Use as instructed  Patient not taking: Reported on 03/14/2024   Myrna Camelia HERO, NP Not Taking Active Self, Pharmacy Records  Lancet Device MISC [id number] No 1 each by Does not apply route 3 (three) times daily. May dispense any manufacturer covered by patient's insurance.  Patient not taking: Reported on 03/14/2024   Arlice Reichert, MD Not Taking Active   Discontinued 04/25/24 1406 (Change in therapy)   metFORMIN  (GLUCOPHAGE -XR) 500 MG 24 hr tablet [id number] Yes Take 2 tablets (1,000 mg total) by mouth daily with breakfast. Nichols, Tonya S, NP  Active   rosuvastatin  (CRESTOR ) 40 MG tablet [id number]  Take 1 tablet (40 mg total) by mouth daily. Oley Chris RAMAN, NP   Active   Semaglutide ,0.25 or 0.5MG /DOS, (OZEMPIC , 0.25 OR 0.5 MG/DOSE,) 2 MG/1.5ML SOPN [id number] Yes Inject 0.5 mg into the skin once a week. Oley Chris RAMAN, NP  Active               Assessment/Plan:   Diabetes: - Currently uncontrolled with A1C of 8.9% above goal < 7%. Control is suboptimal due to needing additional therapy. Patient is tolerating metformin  and glipizide  well without s/sx of hypoglycemia, but he would benefit from a GLP-1RA. Ozempic  PA was previously completed with an estimated copay of $0 per [facility] test claim. Patient was educated on proper administration technique and storage today, along with common side effects. Because Ozempic  is a more effective antihyperglycemic, will discontinue glipizide  to reduce pill burden. Will transition to metformin  XR to address GI side effect of diarrhea with IR formulation. - Reviewed long term cardiovascular and renal outcomes of uncontrolled blood sugar - Reviewed goal A1c, goal fasting, and goal 2 hour post prandial glucose - Reviewed dietary modifications including avoiding carbohydrate-heavy foods and maximizing protein intake.  - Reviewed lifestyle modifications including: increasing  physical activity - Recommend to stop glipizide  ER 5 mg daily with breakfast - Recommend to switch from metformin  IR to metformin  XR 1000 mg BID - Recommend to start Ozempic  0.25 mg weekly x4 weeks, then increase to 0.5 mg weekly - Patient denies personal or family history of multiple endocrine neoplasia type 2, medullary thyroid  cancer; personal history of pancreatitis or gallbladder disease. - Recommend to check glucose once daily fasting - Next A1C due October 2025    Hyperlipidemia/ASCVD Risk Reduction: - Currently uncontrolled with LDL-C of 258 mg/dL above goal <29 mg/dL given U7IF, LDL-C > 809 mg/dL, family hx of premature ASCVD, and comorbidities. Patient should be treated with high intensity statin and will likely require additional therapy  with PCSK9i as well. While patient was amenable to starting high-intensity statin at last telephone call, he did not pick this up from the pharmacy. Discussed importance of starting today and patient expressed understanding.  - Reviewed long term complications of uncontrolled cholesterol - Recommend to start rosuvastatin  40 mg daily. Will call pharmacy to request fill.   Hypertension: - Currently uncontrolled with last clinic BP 138/97 mmHg above goal < 130/80 mmHg. Due to time constraints, was unable to discuss HTN today. Previously patient was not willing to add additional medications.  He is at high CV risk with co-morbidities and family history, but will focus on DM and HLD at this time. Will encourage PCP to discuss BP medication at upcoming appointment  Smoking Cessation: Did not discuss today, but will address at follow-up.  Assisted patient in resetting MyChart access today.   Follow Up Plan: PCP 05/09/24, PharmD in person 06/19/24  Lorain Baseman, PharmD [facility] [phone]

## 2024-04-25 ENCOUNTER — Ambulatory Visit (INDEPENDENT_AMBULATORY_CARE_PROVIDER_SITE_OTHER): Payer: Self-pay

## 2024-04-25 DIAGNOSIS — E119 Type 2 diabetes mellitus without complications: Secondary | ICD-10-CM | POA: Diagnosis not present

## 2024-04-25 LAB — POCT GLYCOSYLATED HEMOGLOBIN (HGB A1C): HbA1c, POC (controlled diabetic range): 8.9 % — AB (ref 0.0–7.0)

## 2024-04-25 MED ORDER — OZEMPIC (0.25 OR 0.5 MG/DOSE) 2 MG/1.5ML ~~LOC~~ SOPN: 0.5000 mg | PEN_INJECTOR | SUBCUTANEOUS | 1 refills | Status: AC

## 2024-04-25 MED ORDER — METFORMIN HCL ER 500 MG PO TB24: 1000.0000 mg | ORAL_TABLET | Freq: Every day | ORAL | 3 refills | Status: AC

## 2024-04-25 NOTE — Patient Instructions (Addendum)
 It was nice to see you today!  Your goal blood sugar is 80-130 before eating and less than 180 after eating.  Medication Changes: START Ozempic  0.25 mg once weekly for 4 weeks, then increase to 0.5 mg . This medication may cause stomach upset, queasiness, or constipation, especially when first starting. This generally improves over time. It is also less likely to happen if you stop eating when you feel full, avoid foods high in grease, fat or sugar, and stay hydrated with water. Call our office if these symptoms occur and worsen, or if you have severe symptoms such as vomiting, diarrhea, or stomach pain.   SWITCH to metformin  extended-release (XR) 1000 mg (two 500 mg tablets) twice daily. This should help reduce stomach side effects  START rosuvastatin  40 mg daily for cholesterol and preventing heart disease. Your cholesterol (LDL-C) at baseline is 258 mg/dL. Our goal is less than 70 mg/dL. We will recheck about 3 months after you start taking this medication  Discontinue glipizide   Continue all other medication the same.   Monitor blood sugars at home and keep a log (glucometer or piece of paper) to bring with you to your next visit.  Keep up the good work with diet and exercise. Aim for a diet full of vegetables, fruit and lean meats (chicken, [place], fish). Try to limit salt intake by eating fresh or frozen vegetables (instead of canned), rinse canned vegetables prior to cooking and do not add any additional salt to meals.

## 2024-05-09 ENCOUNTER — Ambulatory Visit: Payer: Self-pay | Admitting: Nurse Practitioner

## 2024-06-19 ENCOUNTER — Telehealth: Payer: Self-pay

## 2024-06-19 ENCOUNTER — Telehealth: Payer: Self-pay | Admitting: Nurse Practitioner

## 2024-06-19 ENCOUNTER — Ambulatory Visit: Payer: Self-pay

## 2024-06-19 NOTE — Progress Notes (Deleted)
 06/19/2024 Name: Chris Gray MRN: [id number] DOB: 18-Jan-1981  No chief complaint on file.   Chris Gray is a 43 y.o. year old male who presented for a Face-to-Face visit   They were referred to the pharmacist by their PCP for assistance in managing diabetes, hypertension, and hyperlipidemia. PMH includes HTN, T2DM, tobacco use disorder.  Subjective: Patient was last seen by PCP, Bascom Borer, NP on 02/08/24 for hospital f/u after admission for abscess on L buttock/perianal region. He was also treated for syphilis during that admission. BP at last clinic visit was 132/65, HR 84. His last A1C was 9.2%. Patient was engaged by pharmacy via telephone on 03/14/24. He reported that he wasn't able to start Januvia  due to cost, but was taking metformin  and glipizide . He was instructed to start rosuvastatin  40 mg daily given ASCVD risk with family hx of premature ASCVD. A PA was completed for Ozempic  ($0 for 1 mo supply), but patient was not able to be contacted with instructions to start the medication. He was engaged by pharmacy on 04/25/24 for an in-office visit. He reported taking metformin  and glipizide  as prescribed. He had not started rosuvastatin  yet. A1C had decreased to 8.9%. He was instructed to start Ozempic  0.25 mg x4 weeks, then increase to 0.5 mg weekly. He was switched from IR to XR metformin  for AE of diarrhea. He did not show to his PCP appt on 05/09/24.    Today, patient reports doing well. He has been taking metformin  and glipizide  as prescribed, but he did just run out of glipizide . He is intermittently monitoring his BG. He does not have his glucometer with him today.   Care Team: Primary Care Provider: Borer Bascom RAMAN, NP ; Next Scheduled Visit: 05/09/24  Medication Access/Adherence  Current Pharmacy:  [facility] [address] [place]), [place] - [address] [place]) [place] [address] Phone: [phone] Fax: [phone]  [place]  - [facility] [address] [place] [address] Phone: [phone] Fax: [phone]   Patient reports affordability concerns with their medications: No   Patient reports access/transportation concerns to their pharmacy: No  Patient reports adherence concerns with their medications:  No    Reports that he has been out of glipizide  for 2 days.   Diabetes:  Current medications: glipizide  ER 5 mg daily with breakfast, metformin  IR 1000 mg BID, Ozempic  0.5 mg weekly Medications tried in the past: N/A  Patient reports that he is having diarrhea with metformin   Current glucose readings: N/A Using Accu-Chek Guide meter: checks every once in a while - does not recall specific readings.  Patient denies hypoglycemic s/sx including dizziness, shakiness, sweating. Patient denies hyperglycemic symptoms including polyuria, polydipsia, polyphagia, nocturia, neuropathy, blurred vision. Endorses nocutria when he drinks a lot of water.   Current meal patterns: Patient reports that he avoids sugary foods and drinks. Occasional serving of bread or carbohydrate food. Did not discuss in detail today.   Current medication access support: none Ambulance person  Hypertension:  Current medications: none Medications previously tried: N/A  Hyperlipidemia/ASCVD Risk Reduction  Current lipid lowering medications: rosuvastatin  40 mg daily (prescribed at last visit but patient has not picked up from pharmacy) Medications tried in the past: N/A  ASCVD History:  Family History: MI in father- age 72 Risk Factors: Family hx of premature CVD, baseline LDL-C > 190 mg/dL, U7IF   Clinical ASCVD: No  The ASCVD Risk score (Arnett DK, et  al., 2019) failed to calculate for the following reasons:   Cannot find a previous HDL lab   Cannot find a previous total cholesterol lab    Objective:  BP Readings from Last 3 Encounters:  02/21/24 (!) 138/97  02/08/24 132/65  02/01/24 (!)  172/99   Lab Results  Component Value Date   HGBA1C 8.9 (A) 04/25/2024   HGBA1C 9.2 (H) 01/29/2024   HGBA1C 7.7 (A) 12/24/2020   HGBA1C 7.7 12/24/2020   HGBA1C 7.7 (A) 12/24/2020   HGBA1C 7.7 (A) 12/24/2020   UACR 02/08/24: 18 mg/g  Lab Results  Component Value Date   CREATININE 0.74 01/31/2024   BUN 15 01/31/2024   NA 131 (L) 01/31/2024   K 3.6 01/31/2024   CL 98 01/31/2024   CO2 23 01/31/2024    Lab Results  Component Value Date   CHOL 375 (H) 06/11/2020   HDL 41 06/11/2020   LDLCALC 258 (H) 06/11/2020   TRIG 343 (H) 06/11/2020   CHOLHDL 9.1 (H) 06/11/2020    Medications Reviewed Today   Medications were not reviewed in this encounter       Assessment/Plan:   Diabetes: - Currently uncontrolled with A1C of 8.9% above goal < 7%. Control is suboptimal due to needing additional therapy. Patient is tolerating metformin  and glipizide  well without s/sx of hypoglycemia, but he would benefit from a GLP-1RA. Ozempic  PA was previously completed with an estimated copay of $0 per [facility] test claim. Patient was educated on proper administration technique and storage today, along with common side effects. Because Ozempic  is a more effective antihyperglycemic, will discontinue glipizide  to reduce pill burden. Will transition to metformin  XR to address GI side effect of diarrhea with IR formulation. - Reviewed long term cardiovascular and renal outcomes of uncontrolled blood sugar - Reviewed goal A1c, goal fasting, and goal 2 hour post prandial glucose - Reviewed dietary modifications including avoiding carbohydrate-heavy foods and maximizing protein intake.  - Reviewed lifestyle modifications including: increasing physical activity - Recommend to stop glipizide  ER 5 mg daily with breakfast - Recommend to switch from metformin  IR to metformin  XR 1000 mg BID - Recommend to start Ozempic  0.25 mg weekly x4 weeks, then increase to 0.5 mg weekly - Patient denies personal or family history  of multiple endocrine neoplasia type 2, medullary thyroid  cancer; personal history of pancreatitis or gallbladder disease. - Recommend to check glucose once daily fasting - Next A1C due October 2025    Hyperlipidemia/ASCVD Risk Reduction: - Currently uncontrolled with LDL-C of 258 mg/dL above goal <29 mg/dL given U7IF, LDL-C > 809 mg/dL, family hx of premature ASCVD, and comorbidities. Patient should be treated with high intensity statin and will likely require additional therapy with PCSK9i as well. While patient was amenable to starting high-intensity statin at last telephone call, he did not pick this up from the pharmacy. Discussed importance of starting today and patient expressed understanding.  - Reviewed long term complications of uncontrolled cholesterol - Recommend to start rosuvastatin  40 mg daily. Will call pharmacy to request fill.   Hypertension: - Currently uncontrolled with last clinic BP 138/97 mmHg above goal < 130/80 mmHg. Due to time constraints, was unable to discuss HTN today. Previously patient was not willing to add additional medications.  He is at high CV risk with co-morbidities and family history, but will focus on DM and HLD at this time. Will encourage PCP to discuss BP medication at upcoming appointment  Smoking Cessation: Did not discuss today, but will address at  follow-up.  Assisted patient in resetting MyChart access today.   Follow Up Plan: PCP 05/09/24, PharmD in person 06/19/24  Lorain Baseman, PharmD [facility] [phone]

## 2024-06-19 NOTE — Telephone Encounter (Signed)
 Attempted to contact patient for scheduled appointment for medication management and offer to convert in person appt to telephone appt today. Left HIPAA compliant message for patient to return my call at their convenience.   Lorain Baseman, PharmD [facility] [phone]

## 2024-06-20 NOTE — Telephone Encounter (Signed)
 9/9//2025 @ 2:31pm called to resch 2:00pm missed appt. for today @ [facility] left vm.

## 2024-06-28 ENCOUNTER — Telehealth: Payer: Self-pay | Admitting: *Deleted

## 2024-06-28 NOTE — Progress Notes (Signed)
 Complex Care Management Care Guide Note  06/28/2024 Name: [name] MRN: [id number] DOB: 1981/10/05  Chris Gray is a 43 y.o. year old male who is a primary care patient of Oley Bascom RAMAN, NP and is actively engaged with the care management team. I reached out to Arthea DELENA Hint by phone today to assist with re-scheduling  with the Pharmacist.  Follow up plan: Face to Face appointment with complex care management team member scheduled for:  10/22 at 10:30 AM  Harlene [facility]  [facility] Guide  Direct Dial: [phone]  Fax [phone]

## 2024-06-28 NOTE — Progress Notes (Signed)
 Complex Care Management Care Guide Note  06/28/2024 Name: Chris Gray MRN: [id number] DOB: 01-18-1981  Chris Gray is a 43 y.o. year old male who is a primary care patient of Oley Bascom RAMAN, NP and is actively engaged with the care management team. I reached out to Arthea DELENA Hint by phone today to assist with re-scheduling  with the Pharmacist.  Follow up plan: Unsuccessful telephone outreach attempt made. A HIPAA compliant phone message was left for the patient providing contact information and requesting a return call.  Harlene Satterfield  [facility]  [facility] Guide  Direct Dial: [phone]  Fax [phone]

## 2024-07-09 ENCOUNTER — Ambulatory Visit: Payer: Self-pay | Admitting: Nurse Practitioner

## 2024-07-21 ENCOUNTER — Encounter (HOSPITAL_COMMUNITY): Payer: Self-pay

## 2024-07-21 ENCOUNTER — Ambulatory Visit (HOSPITAL_COMMUNITY)
Admission: EM | Admit: 2024-07-21 | Discharge: 2024-07-21 | Disposition: A | Discharge status: Home / Self Care | Attending: Family Medicine | Admitting: Family Medicine

## 2024-07-21 DIAGNOSIS — K529 Noninfective gastroenteritis and colitis, unspecified: Secondary | ICD-10-CM

## 2024-07-21 LAB — POCT FASTING CBG KUC MANUAL ENTRY: POCT Glucose (KUC): 200 mg/dL — AB (ref 70–99)

## 2024-07-21 MED ORDER — ONDANSETRON 4 MG PO TBDP: 4.0000 mg | ORAL_TABLET | Freq: Three times a day (TID) | ORAL | 0 refills | Status: AC | PRN

## 2024-07-21 MED ORDER — ONDANSETRON 4 MG PO TBDP
ORAL_TABLET | ORAL | Status: AC
  Filled 2024-07-21: qty 1

## 2024-07-21 MED ORDER — ONDANSETRON 4 MG PO TBDP
4.0000 mg | ORAL_TABLET | Freq: Once | ORAL | Status: AC
  Administered 2024-07-21: 4 mg via ORAL

## 2024-07-21 NOTE — ED Triage Notes (Signed)
 Pt states that he has some nausea, vomiting and diarrhea. X2 days

## 2024-07-21 NOTE — ED Provider Notes (Signed)
 [facility]    CSN: [id number] Arrival date & time: 07/21/24  1733      History   Chief Complaint Chief Complaint  Patient presents with   Nausea    HPI Chris Gray is a 43 y.o. male.   HPI Here for nausea and vomiting and diarrhea.  Symptoms began yesterday.  He threw up several times yesterday and once or twice today. He has had several episodes of loose stools yesterday and today.  No blood in any output  No fever or chills.  NKDA   he does have diabetes and is continued to take his metformin .  He states he has not started Ozempic  yet.  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   Medication management 02/21/2024   Smoking 1/2 pack a day or less 02/21/2024   Abscess 01/29/2024   Thrombocytosis 01/29/2024   Pseudohyponatremia 01/29/2024   Microcytic anemia 01/29/2024   Type 2 diabetes mellitus with hyperglycemia (HCC) 01/29/2024   Hypokalemia 01/29/2024   Class 1 obesity 01/29/2024   Syphilis 01/29/2024   Tobacco use disorder, continuous 09/27/2020   Secondary hypertension 09/27/2020   T2DM (type 2 diabetes mellitus) (HCC) 07/17/2017   MRSA (methicillin resistant staph aureus) culture positive 07/14/2017    Past Surgical History:  Procedure Laterality Date   ANKLE SURGERY Right    PT reports pins and screws are present   IRRIGATION AND DEBRIDEMENT ABSCESS N/A 01/29/2024   Procedure: IRRIGATION AND DEBRIDEMENT ABSCESS;  Surgeon: Tanda Locus, MD;  Location: [facility];  Service: General;  Laterality: N/A;       Home Medications    Prior to Admission medications   Medication Sig Start Date End Date Taking? Authorizing Provider  blood glucose meter kit and supplies KIT Dispense based on patient and insurance preference. Use up to four times daily as directed. (FOR  ICD-9 250.00, 250.01). 06/12/20  Yes Myrna Camelia HERO, NP  Blood Glucose Monitoring Suppl (BLOOD GLUCOSE MONITOR SYSTEM) w/Device KIT Use to test blood sugar 3 times daily. 02/01/24  Yes Dahal, Chapman, MD  Glucose Blood (BLOOD GLUCOSE TEST STRIPS) STRP Use as directed to test blood sugar 3 times daily. 02/01/24  Yes Dahal, Chapman, MD  glucose blood (RELION TRUE METRIX TEST STRIPS) test strip Use as instructed 07/25/20  Yes Myrna Camelia HERO, NP  Lancet Device MISC 1 each by Does not apply route 3 (three) times daily. May dispense any manufacturer covered by patient's insurance. 02/01/24  Yes Dahal, Chapman, MD  metFORMIN  (GLUCOPHAGE -XR) 500 MG 24 hr tablet Take 2 tablets (1,000 mg total) by mouth daily with breakfast. 04/25/24  Yes Nichols, Tonya S, NP  ondansetron  (ZOFRAN -ODT) 4 MG disintegrating tablet Take 1 tablet (4 mg total) by mouth every 8 (eight) hours as needed for nausea or vomiting. 07/21/24  Yes Vonna Sharlet POUR, MD  rosuvastatin  (CRESTOR ) 40 MG tablet Take 1 tablet (40 mg total) by mouth daily. 03/14/24  Yes Oley Bascom RAMAN, NP  Semaglutide ,0.25 or 0.5MG /DOS, (OZEMPIC , 0.25 OR 0.5 MG/DOSE,) 2 MG/1.5ML SOPN Inject 0.5 mg into the skin once a week. 04/25/24  Yes Oley Bascom RAMAN, NP    Family History Family History  Problem Relation Age of Onset   Healthy Mother    Heart attack Father     Social  History Social History   Tobacco Use   Smoking status: Every Day    Types: Cigarettes   Smokeless tobacco: Never   Tobacco comments:    occasionally  Vaping Use   Vaping status: Never Used  Substance Use Topics   Alcohol use: Yes    Comment: QOD   Drug use: Yes    Types: Marijuana     Allergies   Patient has no known allergies.   Review of Systems Review of Systems   Physical Exam Triage Vital Signs ED Triage Vitals  Encounter Vitals Group     BP 07/21/24 1758 (!) 156/102     Girls Systolic BP Percentile --      Girls Diastolic BP Percentile --      Boys Systolic BP  Percentile --      Boys Diastolic BP Percentile --      Pulse Rate 07/21/24 1758 (!) 113     Resp 07/21/24 1758 18     Temp 07/21/24 1758 98.1 F (36.7 C)     Temp src --      SpO2 07/21/24 1758 97 %     Weight 07/21/24 1756 235 lb (106.6 kg)     Height 07/21/24 1756 5' 11 (1.803 m)     Head Circumference --      Peak Flow --      Pain Score 07/21/24 1756 0     Pain Loc --      Pain Education --      Exclude from Growth Chart --    No data found.  Updated Vital Signs BP (!) 156/102 (BP Location: Left Arm)   Pulse (!) 113   Temp 98.1 F (36.7 C)   Resp 18   Ht 5' 11 (1.803 m)   Wt 106.6 kg   SpO2 97%   BMI 32.78 kg/m   Visual Acuity Right Eye Distance:   Left Eye Distance:   Bilateral Distance:    Right Eye Near:   Left Eye Near:    Bilateral Near:     Physical Exam Vitals reviewed.  Constitutional:      General: He is not in acute distress.    Appearance: He is not ill-appearing, toxic-appearing or diaphoretic.  HENT:     Mouth/Throat:     Mouth: Mucous membranes are moist.  Eyes:     Extraocular Movements: Extraocular movements intact.     Conjunctiva/sclera: Conjunctivae normal.     Pupils: Pupils are equal, round, and reactive to light.  Cardiovascular:     Rate and Rhythm: Regular rhythm. Tachycardia present.     Heart sounds: No murmur heard. Pulmonary:     Effort: Pulmonary effort is normal.     Breath sounds: Normal breath sounds.  Abdominal:     General: Bowel sounds are normal. There is no distension.     Palpations: Abdomen is soft.     Tenderness: There is no abdominal tenderness. There is no guarding.  Musculoskeletal:     Cervical back: Neck supple.  Lymphadenopathy:     Cervical: No cervical adenopathy.  Skin:    Coloration: Skin is not jaundiced or pale.  Neurological:     General: No focal deficit present.     Mental Status: He is alert and oriented to person, place, and time.  Psychiatric:        Behavior: Behavior normal.       UC Treatments / Results  Labs (all labs ordered are listed, but only  abnormal results are displayed) Labs Reviewed  POCT FASTING CBG [facility] MANUAL ENTRY - Abnormal; Notable for the following components:      Result Value   POCT Glucose ([facility]) 200 (*)    All other components within normal limits    EKG   Radiology No results found.  Procedures Procedures (including critical care time)  Medications Ordered in UC Medications  ondansetron  (ZOFRAN -ODT) disintegrating tablet 4 mg (has no administration in time range)    Initial Impression / Assessment and Plan / UC Course  I have reviewed the triage vital signs and the nursing notes.  Pertinent labs & imaging results that were available during my care of the patient were reviewed by me and considered in my medical decision making (see chart for details).     Sugar fingerstick is 200.  Zofran  is given here and sent to the pharmacy also.  I discussed with him not to take the metformin  if he could not keep food down.  He will go to the emergency room if not improving. Final Clinical Impressions(s) / UC Diagnoses   Final diagnoses:  Gastroenteritis     Discharge Instructions      Your sugar was 200.  Ondansetron  dissolved in the mouth every 8 hours as needed for nausea or vomiting. Clear liquids(water, gatorade/pedialyte, ginger ale/sprite, chicken broth/soup) and bland things(crackers/toast, rice, potato, bananas) to eat. Avoid acidic foods like lemon/lime/orange/tomato, and avoid greasy/spicy foods. We have given you 1 dose of this medication here in the office  Be careful --if you cannot eat much or get much calories in, it is probably best not to take your metformin  so that your sugar does not drop too low.  If you worsen anyway or are not able to keep fluids down, then please go to the emergency room.     ED Prescriptions     Medication Sig Dispense Auth. Provider   ondansetron  (ZOFRAN -ODT) 4 MG  disintegrating tablet Take 1 tablet (4 mg total) by mouth every 8 (eight) hours as needed for nausea or vomiting. 10 tablet Vonna Jaydrien Wassenaar K, MD      PDMP not reviewed this encounter.   Vonna Sharlet POUR, MD 07/21/24 JER[place]YN

## 2024-07-21 NOTE — Discharge Instructions (Signed)
 Your sugar was 200.  Ondansetron  dissolved in the mouth every 8 hours as needed for nausea or vomiting. Clear liquids(water, gatorade/pedialyte, ginger ale/sprite, chicken broth/soup) and bland things(crackers/toast, rice, potato, bananas) to eat. Avoid acidic foods like lemon/lime/orange/tomato, and avoid greasy/spicy foods. We have given you 1 dose of this medication here in the office  Be careful --if you cannot eat much or get much calories in, it is probably best not to take your metformin  so that your sugar does not drop too low.  If you worsen anyway or are not able to keep fluids down, then please go to the emergency room.

## 2024-08-01 ENCOUNTER — Ambulatory Visit: Payer: Self-pay

## 2024-08-01 ENCOUNTER — Telehealth: Payer: Self-pay

## 2024-08-01 NOTE — Progress Notes (Deleted)
 08/01/2024 Name: Chris Gray MRN: [id number] DOB: 01/23/1981  No chief complaint on file.   Chris Gray is a 43 y.o. year old male who presented for a Face-to-Face visit   They were referred to the pharmacist by their PCP for assistance in managing diabetes, hypertension, and hyperlipidemia. PMH includes HTN, T2DM, tobacco use disorder.  Subjective: Patient was last seen by PCP, Chris Borer, Gray on 02/08/24 for hospital f/u after admission for abscess on L buttock/perianal region. He was also treated for syphilis during that admission. BP at last clinic visit was 132/65, HR 84. His last A1C was 9.2%. Patient was last engaged by pharmacy via telephone on 03/14/24. He reported that he wasn't able to start Januvia  due to cost, but was taking metformin  an dglipizide. He was instructed to start rosuvastatin  40 mg daily given ASCVD risk with family hx of premature ASCVD. A PA was completed for Ozempic  ($0 for 1 mo supply), but patient was not able to be contacted with instructions to start the medication. He came in person for a pharmacy visit on 04/25/24. Instructed to start Ozempic  and rosuvastatin  as previously discusse.d  Today, patient reports doing well. He has been taking metformin  and glipizide  as prescribed, but he did just run out of glipizide . He is intermittently monitoring his BG. He does not have his glucometer with him today.   LIBERATE? Restart Ozempic ? A1C and recheck lipid panel if he has been taking rosuvastatin   Care Team: Primary Care Provider: Borer Chris Chris Gray ; Next Scheduled Visit: 05/09/24  Medication Access/Adherence  Current Pharmacy:  [id number] - [address] ([place]), [place] - [address] [place] ([place]) [place] [address] Phone: [phone] Fax: [phone]  [place] [facility] [address] [place] [address] Phone: [phone] Fax: [phone]   Patient reports affordability  concerns with their medications: No   Patient reports access/transportation concerns to their pharmacy: No  Patient reports adherence concerns with their medications:  No    Reports that he has been out of glipizide  for 2 days.   Diabetes:  Current medications: glipizide  ER 5 mg daily with breakfast, metformin  IR 1000 mg BID Medications tried in the past: N/A  Patient reports that he is having diarrhea with metformin   Current glucose readings: N/A Using Accu-Chek Guide meter: checks every once in a while - does not recall specific readings.  Patient denies hypoglycemic s/sx including dizziness, shakiness, sweating. Patient denies hyperglycemic symptoms including polyuria, polydipsia, polyphagia, nocturia, neuropathy, blurred vision. Endorses nocutria when he drinks a lot of water.   Current meal patterns: Patient reports that he avoids sugary foods and drinks. Occasional serving of bread or carbohydrate food. Did not discuss in detail today.   Current medication access support: none Ambulance person  Hypertension:  Current medications: none Medications previously tried: N/A  Hyperlipidemia/ASCVD Risk Reduction  Current lipid lowering medications: rosuvastatin  40 mg daily (prescribed at last visit but patient has not picked up from pharmacy) Medications tried in the past: N/A  ASCVD History:  Family History: MI in father- age 73 Risk Factors: Family hx of premature CVD, baseline LDL-C > 190 mg/dL, U7IF   Clinical ASCVD: No  The ASCVD Risk score (Arnett DK, et al., 2019) failed to calculate for the following reasons:   Cannot find a previous HDL lab   Cannot find a previous total cholesterol lab    Objective:  BP Readings from Last 3 Encounters:  07/21/24 ROLLEN)  156/102  02/21/24 (!) 138/97  02/08/24 132/65   Lab Results  Component Value Date   HGBA1C 8.9 (A) 04/25/2024   HGBA1C 9.2 (H) 01/29/2024   HGBA1C 7.7 (A) 12/24/2020   HGBA1C 7.7 12/24/2020   HGBA1C  7.7 (A) 12/24/2020   HGBA1C 7.7 (A) 12/24/2020   UACR 02/08/24: 18 mg/g  Lab Results  Component Value Date   CREATININE 0.74 01/31/2024   BUN 15 01/31/2024   NA 131 (L) 01/31/2024   K 3.6 01/31/2024   CL 98 01/31/2024   CO2 23 01/31/2024    Lab Results  Component Value Date   CHOL 375 (H) 06/11/2020   HDL 41 06/11/2020   LDLCALC 258 (H) 06/11/2020   TRIG 343 (H) 06/11/2020   CHOLHDL 9.1 (H) 06/11/2020    Medications Reviewed Today   Medications were not reviewed in this encounter       Assessment/Plan:   Diabetes: - Currently uncontrolled with A1C of 8.9% above goal < 7%. Control is suboptimal due to needing additional therapy. Patient is tolerating metformin  and glipizide  well without s/sx of hypoglycemia, but he would benefit from a GLP-1RA. Ozempic  PA was previously completed with an estimated copay of $0 per [facility] test claim. Patient was educated on proper administration technique and storage today, along with common side effects. Because Ozempic  is a more effective antihyperglycemic, will discontinue glipizide  to reduce pill burden. Will transition to metformin  XR to address GI side effect of diarrhea with IR formulation. - Reviewed long term cardiovascular and renal outcomes of uncontrolled blood sugar - Reviewed goal A1c, goal fasting, and goal 2 hour post prandial glucose - Reviewed dietary modifications including avoiding carbohydrate-heavy foods and maximizing protein intake.  - Reviewed lifestyle modifications including: increasing physical activity - Recommend to stop glipizide  ER 5 mg daily with breakfast - Recommend to switch from metformin  IR to metformin  XR 1000 mg BID - Recommend to start Ozempic  0.25 mg weekly x4 weeks, then increase to 0.5 mg weekly - Patient denies personal or family history of multiple endocrine neoplasia type 2, medullary thyroid  cancer; personal history of pancreatitis or gallbladder disease. - Recommend to check glucose once daily  fasting - Next A1C due October 2025    Hyperlipidemia/ASCVD Risk Reduction: - Currently uncontrolled with LDL-C of 258 mg/dL above goal <29 mg/dL given U7IF, LDL-C > 809 mg/dL, family hx of premature ASCVD, and comorbidities. Patient should be treated with high intensity statin and will likely require additional therapy with PCSK9i as well. While patient was amenable to starting high-intensity statin at last telephone call, he did not pick this up from the pharmacy. Discussed importance of starting today and patient expressed understanding.  - Reviewed long term complications of uncontrolled cholesterol - Recommend to start rosuvastatin  40 mg daily. Will call pharmacy to request fill.   Hypertension: - Currently uncontrolled with last clinic BP 138/97 mmHg above goal < 130/80 mmHg. Due to time constraints, was unable to discuss HTN today. Previously patient was not willing to add additional medications.  He is at high CV risk with co-morbidities and family history, but will focus on DM and HLD at this time. Will encourage PCP to discuss BP medication at upcoming appointment  Smoking Cessation: Did not discuss today, but will address at follow-up.  Assisted patient in resetting MyChart access today.   Follow Up Plan: PCP 05/09/24, PharmD in person 06/19/24  Lorain Baseman, PharmD [facility] [phone]

## 2024-08-01 NOTE — Telephone Encounter (Signed)
 Attempted to contact patient for scheduled appointment for medication management. Patient reports he is not able to convert to a telephone appointment. Requests to reschedule. Scheduled for in person appt on 08/29/24.   Lorain Baseman, PharmD [facility] [phone]

## 2024-08-13 ENCOUNTER — Telehealth: Payer: Self-pay

## 2024-08-13 NOTE — Telephone Encounter (Signed)
 rosuvastatin  (CRESTOR ) 40 MG tablet [[id number]]

## 2024-08-14 NOTE — Telephone Encounter (Signed)
 Done [facility]

## 2024-08-29 ENCOUNTER — Ambulatory Visit: Payer: Self-pay

## 2024-08-29 NOTE — Progress Notes (Deleted)
 08/29/2024 Name: Chris Gray MRN: [id number] DOB: Dec 21, 1980  No chief complaint on file.   Chris Gray is a 43 y.o. year old male who presented for a Face-to-Face visit   They were referred to the pharmacist by their PCP for assistance in managing diabetes, hypertension, and hyperlipidemia. PMH includes HTN, T2DM, tobacco use disorder.  Subjective: Patient was last seen by PCP, Bascom Borer, NP on 02/08/24 for hospital f/u after admission for abscess on L buttock/perianal region. He was also treated for syphilis during that admission. BP at last clinic visit was 132/65, HR 84. His last A1C was 9.2%. Patient was last engaged by pharmacy via telephone on 03/14/24. He reported that he wasn't able to start Januvia  due to cost, but was taking metformin  an dglipizide. He was instructed to start rosuvastatin  40 mg daily given ASCVD risk with family hx of premature ASCVD. A PA was completed for Ozempic  ($0 for 1 mo supply), but patient was not able to be contacted with instructions to start the medication. He came in person for a pharmacy visit on 04/25/24. Instructed to start Ozempic  and rosuvastatin  as previously discussed.   Today, patient reports doing well. He has been taking metformin  and glipizide  as prescribed, but he did just run out of glipizide . He is intermittently monitoring his BG. He does not have his glucometer with him today.   LIBERATE? Restart Ozempic ? A1C and recheck lipid panel if he has been taking rosuvastatin   Care Team: Primary Care Provider: Borer Bascom RAMAN, NP ; Next Scheduled Visit: 05/09/24  Medication Access/Adherence  Current Pharmacy:  [id number] - [address] ([place]), [place] - [address] [place] ([place]) [place] [address] Phone: [phone] Fax: [phone]  [place] [facility] [address] [place] [address] Phone: [phone] Fax: [phone]   Patient reports affordability  concerns with their medications: No   Patient reports access/transportation concerns to their pharmacy: No  Patient reports adherence concerns with their medications:  No    Reports that he has been out of glipizide  for 2 days.   Diabetes:  Current medications: glipizide  ER 5 mg daily with breakfast, metformin  IR 1000 mg BID, Ozempic  0.5 mg weekly Medications tried in the past: N/A  Patient reports that he is having diarrhea with metformin   Current glucose readings: N/A Using Accu-Chek Guide meter: checks every once in a while - does not recall specific readings.  Patient denies hypoglycemic s/sx including dizziness, shakiness, sweating. Patient denies hyperglycemic symptoms including polyuria, polydipsia, polyphagia, nocturia, neuropathy, blurred vision. Endorses nocutria when he drinks a lot of water.   Current meal patterns: Patient reports that he avoids sugary foods and drinks. Occasional serving of bread or carbohydrate food. Did not discuss in detail today.   Current medication access support: none Ambulance Person  Hypertension:  Current medications: none Medications previously tried: N/A  Hyperlipidemia/ASCVD Risk Reduction  Current lipid lowering medications: rosuvastatin  40 mg daily (prescribed at last visit but patient has not picked up from pharmacy) Medications tried in the past: N/A  ASCVD History:  Family History: MI in father- age 25 Risk Factors: Family hx of premature CVD, baseline LDL-C > 190 mg/dL, U7IF   Clinical ASCVD: No  The ASCVD Risk score (Arnett DK, et al., 2019) failed to calculate for the following reasons:   Cannot find a previous HDL lab   Cannot find a previous total cholesterol lab    Objective:  BP Readings from  Last 3 Encounters:  07/21/24 (!) 156/102  02/21/24 (!) 138/97  02/08/24 132/65   Lab Results  Component Value Date   HGBA1C 8.9 (A) 04/25/2024   HGBA1C 9.2 (H) 01/29/2024   HGBA1C 7.7 (A) 12/24/2020   HGBA1C 7.7  12/24/2020   HGBA1C 7.7 (A) 12/24/2020   HGBA1C 7.7 (A) 12/24/2020   UACR 02/08/24: 18 mg/g  Lab Results  Component Value Date   CREATININE 0.74 01/31/2024   BUN 15 01/31/2024   NA 131 (L) 01/31/2024   K 3.6 01/31/2024   CL 98 01/31/2024   CO2 23 01/31/2024    Lab Results  Component Value Date   CHOL 375 (H) 06/11/2020   HDL 41 06/11/2020   LDLCALC 258 (H) 06/11/2020   TRIG 343 (H) 06/11/2020   CHOLHDL 9.1 (H) 06/11/2020    Medications Reviewed Today   Medications were not reviewed in this encounter       Assessment/Plan:   Diabetes: - Currently uncontrolled with A1C of 8.9% above goal < 7%. Control is suboptimal due to needing additional therapy. Patient is tolerating metformin  and glipizide  well without s/sx of hypoglycemia, but he would benefit from a GLP-1RA. Ozempic  PA was previously completed with an estimated copay of $0 per [facility] test claim. Patient was educated on proper administration technique and storage today, along with common side effects. Because Ozempic  is a more effective antihyperglycemic, will discontinue glipizide  to reduce pill burden. Will transition to metformin  XR to address GI side effect of diarrhea with IR formulation. - Reviewed long term cardiovascular and renal outcomes of uncontrolled blood sugar - Reviewed goal A1c, goal fasting, and goal 2 hour post prandial glucose - Reviewed dietary modifications including avoiding carbohydrate-heavy foods and maximizing protein intake.  - Reviewed lifestyle modifications including: increasing physical activity - Recommend to stop glipizide  ER 5 mg daily with breakfast - Recommend to switch from metformin  IR to metformin  XR 1000 mg BID - Recommend to start Ozempic  0.25 mg weekly x4 weeks, then increase to 0.5 mg weekly - Patient denies personal or family history of multiple endocrine neoplasia type 2, medullary thyroid  cancer; personal history of pancreatitis or gallbladder disease. - Recommend to check  glucose once daily fasting - Next A1C due October 2025    Hyperlipidemia/ASCVD Risk Reduction: - Currently uncontrolled with LDL-C of 258 mg/dL above goal <29 mg/dL given U7IF, LDL-C > 809 mg/dL, family hx of premature ASCVD, and comorbidities. Patient should be treated with high intensity statin and will likely require additional therapy with PCSK9i as well. While patient was amenable to starting high-intensity statin at last telephone call, he did not pick this up from the pharmacy. Discussed importance of starting today and patient expressed understanding.  - Reviewed long term complications of uncontrolled cholesterol - Recommend to start rosuvastatin  40 mg daily. Will call pharmacy to request fill.   Hypertension: - Currently uncontrolled with last clinic BP 138/97 mmHg above goal < 130/80 mmHg. Due to time constraints, was unable to discuss HTN today. Previously patient was not willing to add additional medications.  He is at high CV risk with co-morbidities and family history, but will focus on DM and HLD at this time. Will encourage PCP to discuss BP medication at upcoming appointment  Smoking Cessation: Did not discuss today, but will address at follow-up.  Assisted patient in resetting MyChart access today.   Follow Up Plan: PCP 05/09/24, PharmD in person 06/19/24  Lorain Baseman, PharmD [facility] [phone]

## 2024-08-30 ENCOUNTER — Telehealth: Payer: Self-pay | Admitting: Pharmacy Technician

## 2024-08-30 NOTE — Progress Notes (Signed)
   08/30/2024  Patient ID: Arthea DELENA Hint, male   DOB: 06-01-1981, 43 y.o.   MRN: [id number]  Patient engaged with clinical pharmacist for management of diabetes on 04/25/2024. Outreach by [employer] technician was requested.   Outreached patient to discuss diabetes medication management. Left voicemail for patient to return my call at their convenience.    Jc Veron, CPhT [facility] Office: [phone] Email: [email or web address]

## 2024-08-31 ENCOUNTER — Telehealth: Payer: Self-pay

## 2024-08-31 NOTE — Progress Notes (Signed)
 Complex Care Management Care Guide Note  08/31/2024 Name: Chris Gray MRN: [id number] DOB: June 26, 1981  Chris Gray is a 43 y.o. year old male who is a primary care patient of Oley Bascom RAMAN, NP and is actively engaged with the care management team. I reached out to Arthea DELENA Hint by phone today to assist with re-scheduling  with the Pharmacist.  Follow up plan: Unsuccessful telephone outreach attempt made. A HIPAA compliant phone message was left for the patient providing contact information and requesting a return call.  [name] [facility], [facility] Guide  Direct Dial: [phone]  Fax [phone]

## 2024-09-03 ENCOUNTER — Telehealth: Payer: Self-pay | Admitting: Pharmacy Technician

## 2024-09-03 NOTE — Progress Notes (Signed)
 09/03/2024 Name: Chris Gray MRN: [id number] DOB: 1981/05/01  Patient is appearing for a follow-up visit with the population health pharmacy technician. Last engaged with the clinical pharmacist to discuss diabetes on 04/25/2024. Contacted patient today to discuss diabetes.   Plan from last clinical pharmacist appointment:  Diabetes: - Currently uncontrolled with A1C of 8.9% above goal < 7%. Control is suboptimal due to needing additional therapy. Patient is tolerating metformin  and glipizide  well without s/sx of hypoglycemia, but he would benefit from a GLP-1RA. Ozempic  PA was previously completed with an estimated copay of $0 per [facility] test claim. Patient was educated on proper administration technique and storage today, along with common side effects. Because Ozempic  is a more effective antihyperglycemic, will discontinue glipizide  to reduce pill burden. Will transition to metformin  XR to address GI side effect of diarrhea with IR formulation. - Reviewed long term cardiovascular and renal outcomes of uncontrolled blood sugar - Reviewed goal A1c, goal fasting, and goal 2 hour post prandial glucose - Reviewed dietary modifications including avoiding carbohydrate-heavy foods and maximizing protein intake.  - Reviewed lifestyle modifications including: increasing physical activity - Recommend to stop glipizide  ER 5 mg daily with breakfast - Recommend to switch from metformin  IR to metformin  XR 1000 mg BID - Recommend to start Ozempic  0.25 mg weekly x4 weeks, then increase to 0.5 mg weekly - Patient denies personal or family history of multiple endocrine neoplasia type 2, medullary thyroid  cancer; personal history of pancreatitis or gallbladder disease. - Recommend to check glucose once daily fasting - Next A1C due October 2025 Hyperlipidemia/ASCVD Risk Reduction: - Currently uncontrolled with LDL-C of 258 mg/dL above goal <29 mg/dL given U7IF, LDL-C > 809 mg/dL, family hx of premature ASCVD,  and comorbidities. Patient should be treated with high intensity statin and will likely require additional therapy with PCSK9i as well. While patient was amenable to starting high-intensity statin at last telephone call, he did not pick this up from the pharmacy. Discussed importance of starting today and patient expressed understanding.  - Reviewed long term complications of uncontrolled cholesterol - Recommend to start rosuvastatin  40 mg daily. Will call pharmacy to request fill.  Hypertension: - Currently uncontrolled with last clinic BP 138/97 mmHg above goal < 130/80 mmHg. Due to time constraints, was unable to discuss HTN today. Previously patient was not willing to add additional medications.  He is at high CV risk with co-morbidities and family history, but will focus on DM and HLD at this time. Will encourage PCP to discuss BP medication at upcoming appointment Smoking Cessation: Did not discuss today, but will address at follow-up. -Assisted patient in resetting MyChart access today.  -Follow Up Plan: PCP 05/09/24, PharmD in person 06/19/24(copy/paste from last note)   Medication Adherence Barriers Identified:  Patient made recommended medication changes per plan: No Patient informs he is only taking Metformin  XR 2 tablets daily for his blood sugar. He was taking 4 but it was decreased when Ozempic  was ordered. Patient informs he is not taking Ozempic  and has not started taking Ozempic . He informs he is afraid to take Ozempic  because he does not wan to lose too much weight. Patient informs he is taking Rosuvastatin  daily for his cholesterol.  Access issues with any new medication or testing device: No Metformin  was last filled on 07/19/2024 for 180 tablets or 90 day supply. Rosuvastatin  last filled on 7/25 for 90 tablets. Inquired if patient needs a refill sent in for this and if he had the medication on  hand to take daily. Patient informs he has the medication to take and does not need a refill  at this time. Of note, patient should be out of the medication based on fill history.  Patient is checking blood sugars as prescribed: No Patient checks his blood sugar once or twice a week. He informs the last time he took it was last week and it was 200 something.   Medication Adherence Barriers Addressed/Actions Taken:  Reviewed medication changes per plan from last clinical pharmacist note Will discuss medication adherence concerns with pharmacist Educated patient to contact pharmacy regarding new prescriptions Reviewed instructions for monitoring blood sugars at home and reminded patient to keep a written log to review with pharmacist Reminded patient of date/time of upcoming clinical pharmacist follow up and any upcoming PCP/specialists visits. Patient denies transportation barriers to the appointment. Yes  Next clinical pharmacist appointment is scheduled for: 10/30/2024  No PCP appointment scheduled.  Danica Camarena, CPhT [facility] Office: [phone] Email: [email or web address]

## 2024-10-30 ENCOUNTER — Ambulatory Visit: Payer: Self-pay

## 2024-10-30 ENCOUNTER — Telehealth: Payer: Self-pay

## 2024-10-30 NOTE — Progress Notes (Unsigned)
 "  10/30/2024 Name: Chris Gray MRN: [id number] DOB: 1981-05-27  No chief complaint on file.   Chris Gray is a 44 y.o. year old male who presented for a Face-to-Face visit   They were referred to the pharmacist by their PCP for assistance in managing diabetes, hypertension, and hyperlipidemia. PMH includes HTN, T2DM, tobacco use disorder.  Subjective: Patient was last seen by PCP, Bascom Borer, NP on 02/08/24 for hospital f/u after admission for abscess on L buttock/perianal region. He was also treated for syphilis during that admission. BP at last clinic visit was 132/65, HR 84. His last A1C was 9.2%. Patient was last engaged by pharmacy via telephone on 03/14/24. He reported that he wasn't able to start Januvia  due to cost, but was taking metformin  an dglipizide. He was instructed to start rosuvastatin  40 mg daily given ASCVD risk with family hx of premature ASCVD. A PA was completed for Ozempic  ($0 for 1 mo supply), but patient was not able to be contacted with instructions to start the medication.   Today, patient reports doing well. He has been taking metformin  and glipizide  as prescribed, but he did just run out of glipizide . He is intermittently monitoring his BG. He does not have his glucometer with him today.   Care Team: Primary Care Provider: Borer Bascom RAMAN, NP ; Next Scheduled Visit: 05/09/24  Medication Access/Adherence  Current Pharmacy:  [facility] [address] [place]), [place] - [address] [place] ([place]) [place] [address] Phone: [phone] Fax: [phone]  [place] [facility] [address] [place] [address] Phone: [phone] Fax: [phone]   Patient reports affordability concerns with their medications: No   Patient reports access/transportation concerns to their pharmacy: No  Patient reports adherence concerns with their medications:  No    Reports that he has been out of glipizide   for 2 days.   Diabetes:  Current medications: glipizide  ER 5 mg daily with breakfast, metformin  IR 1000 mg BID Medications tried in the past: N/A  Patient reports that he is having diarrhea with metformin   Current glucose readings: N/A Using Accu-Chek Guide meter: checks every once in a while - does not recall specific readings.  Patient denies hypoglycemic s/sx including dizziness, shakiness, sweating. Patient denies hyperglycemic symptoms including polyuria, polydipsia, polyphagia, nocturia, neuropathy, blurred vision. Endorses nocutria when he drinks a lot of water.   Current meal patterns: Patient reports that he avoids sugary foods and drinks. Occasional serving of bread or carbohydrate food. Did not discuss in detail today.   Current medication access support: none Ambulance Person  Hypertension:  Current medications: none Medications previously tried: N/A  Hyperlipidemia/ASCVD Risk Reduction  Current lipid lowering medications: rosuvastatin  40 mg daily (prescribed at last visit but patient has not picked up from pharmacy) Medications tried in the past: N/A  ASCVD History:  Family History: MI in father- age 65 Risk Factors: Family hx of premature CVD, baseline LDL-C > 190 mg/dL, U7IF   Clinical ASCVD: No  The ASCVD Risk score (Arnett DK, et al., 2019) failed to calculate for the following reasons:   Cannot find a previous HDL lab   Cannot find a previous total cholesterol lab   * - Cholesterol units were assumed    Objective:  BP Readings from Last 3 Encounters:  07/21/24 (!) 156/102  02/21/24 (!) 138/97  02/08/24 132/65   Lab Results  Component Value Date   HGBA1C 8.9 (A) 04/25/2024  HGBA1C 9.2 (H) 01/29/2024   HGBA1C 7.7 (A) 12/24/2020   HGBA1C 7.7 12/24/2020   HGBA1C 7.7 (A) 12/24/2020   HGBA1C 7.7 (A) 12/24/2020   UACR 02/08/24: 18 mg/g  Lab Results  Component Value Date   CREATININE 0.74 01/31/2024   BUN 15 01/31/2024   NA 131 (L)  01/31/2024   K 3.6 01/31/2024   CL 98 01/31/2024   CO2 23 01/31/2024    Lab Results  Component Value Date   CHOL 375 (H) 06/11/2020   HDL 41 06/11/2020   LDLCALC 258 (H) 06/11/2020   TRIG 343 (H) 06/11/2020   CHOLHDL 9.1 (H) 06/11/2020    Medications Reviewed Today   Medications were not reviewed in this encounter       Assessment/Plan:   Diabetes: - Currently uncontrolled with A1C of 8.9% above goal < 7%. Control is suboptimal due to needing additional therapy. Patient is tolerating metformin  and glipizide  well without s/sx of hypoglycemia, but he would benefit from a GLP-1RA. Ozempic  PA was previously completed with an estimated copay of $0 per [facility] test claim. Patient was educated on proper administration technique and storage today, along with common side effects. Because Ozempic  is a more effective antihyperglycemic, will discontinue glipizide  to reduce pill burden. Will transition to metformin  XR to address GI side effect of diarrhea with IR formulation. - Reviewed long term cardiovascular and renal outcomes of uncontrolled blood sugar - Reviewed goal A1c, goal fasting, and goal 2 hour post prandial glucose - Reviewed dietary modifications including avoiding carbohydrate-heavy foods and maximizing protein intake.  - Reviewed lifestyle modifications including: increasing physical activity - Recommend to stop glipizide  ER 5 mg daily with breakfast - Recommend to switch from metformin  IR to metformin  XR 1000 mg BID - Recommend to start Ozempic  0.25 mg weekly x4 weeks, then increase to 0.5 mg weekly - Patient denies personal or family history of multiple endocrine neoplasia type 2, medullary thyroid  cancer; personal history of pancreatitis or gallbladder disease. - Recommend to check glucose once daily fasting - Next A1C due October 2025    Hyperlipidemia/ASCVD Risk Reduction: - Currently uncontrolled with LDL-C of 258 mg/dL above goal <29 mg/dL given U7IF, LDL-C > 809 mg/dL,  family hx of premature ASCVD, and comorbidities. Patient should be treated with high intensity statin and will likely require additional therapy with PCSK9i as well. While patient was amenable to starting high-intensity statin at last telephone call, he did not pick this up from the pharmacy. Discussed importance of starting today and patient expressed understanding.  - Reviewed long term complications of uncontrolled cholesterol - Recommend to start rosuvastatin  40 mg daily. Will call pharmacy to request fill.   Hypertension: - Currently uncontrolled with last clinic BP 138/97 mmHg above goal < 130/80 mmHg. Due to time constraints, was unable to discuss HTN today. Previously patient was not willing to add additional medications.  He is at high CV risk with co-morbidities and family history, but will focus on DM and HLD at this time. Will encourage PCP to discuss BP medication at upcoming appointment  Smoking Cessation: Did not discuss today, but will address at follow-up.  Assisted patient in resetting MyChart access today.   Follow Up Plan: PCP 05/09/24, PharmD in person 06/19/24  Lorain Baseman, PharmD [facility] [phone]    "

## 2024-10-30 NOTE — Progress Notes (Signed)
 Attempted to contact patient for scheduled appointment for medication management. Left HIPAA compliant message for patient to return my call at their convenience.   Noted patient has not seen PCP since April 2025. He has also missed last several pharmacy appts scheduled in person or over the phone. Left VM requesting that patient schedule appt with PCP.  Lorain Baseman, PharmD [facility] [phone]

## 2024-10-30 NOTE — Telephone Encounter (Signed)
 Attempted to contact Pt. And left a V/M asking him to give us  a call back or contact us  through MyChart
